# Patient Record
Sex: Female | Born: 1945 | Race: White | Hispanic: No | Smoking: Never smoker
Health system: Southern US, Community
[De-identification: ages and names within clinical notes are randomized; demographics above are authoritative.]

## PROBLEM LIST (undated history)

## (undated) DIAGNOSIS — H269 Unspecified cataract: Secondary | ICD-10-CM

## (undated) DIAGNOSIS — M1990 Unspecified osteoarthritis, unspecified site: Secondary | ICD-10-CM

## (undated) DIAGNOSIS — F32A Depression, unspecified: Secondary | ICD-10-CM

## (undated) DIAGNOSIS — E785 Hyperlipidemia, unspecified: Secondary | ICD-10-CM

## (undated) DIAGNOSIS — F419 Anxiety disorder, unspecified: Secondary | ICD-10-CM

## (undated) DIAGNOSIS — T7840XA Allergy, unspecified, initial encounter: Secondary | ICD-10-CM

## (undated) DIAGNOSIS — R32 Unspecified urinary incontinence: Secondary | ICD-10-CM

## (undated) DIAGNOSIS — I10 Essential (primary) hypertension: Secondary | ICD-10-CM

## (undated) HISTORY — DX: Unspecified cataract: H26.9

## (undated) HISTORY — DX: Anxiety disorder, unspecified: F41.9

## (undated) HISTORY — DX: Depression, unspecified: F32.A

## (undated) HISTORY — PX: BREAST SURGERY: SHX581

## (undated) HISTORY — DX: Unspecified osteoarthritis, unspecified site: M19.90

## (undated) HISTORY — DX: Essential (primary) hypertension: I10

## (undated) HISTORY — DX: Allergy, unspecified, initial encounter: T78.40XA

## (undated) HISTORY — DX: Unspecified urinary incontinence: R32

## (undated) HISTORY — DX: Hyperlipidemia, unspecified: E78.5

---

## 1976-05-07 HISTORY — PX: OTHER SURGICAL HISTORY: SHX169

## 2005-10-23 ENCOUNTER — Encounter: Admission: RE | Admit: 2005-10-23 | Discharge: 2005-10-23 | Payer: Self-pay | Admitting: Gynecology

## 2006-04-08 ENCOUNTER — Other Ambulatory Visit: Admission: RE | Admit: 2006-04-08 | Discharge: 2006-04-08 | Payer: Self-pay | Admitting: Gynecology

## 2006-11-28 ENCOUNTER — Encounter: Admission: RE | Admit: 2006-11-28 | Discharge: 2006-11-28 | Payer: Self-pay | Admitting: Gynecology

## 2007-12-18 ENCOUNTER — Encounter: Admission: RE | Admit: 2007-12-18 | Discharge: 2007-12-18 | Payer: Self-pay | Admitting: Gynecology

## 2008-12-23 ENCOUNTER — Encounter: Admission: RE | Admit: 2008-12-23 | Discharge: 2008-12-23 | Payer: Self-pay | Admitting: Gynecology

## 2009-12-29 ENCOUNTER — Encounter: Admission: RE | Admit: 2009-12-29 | Discharge: 2009-12-29 | Payer: Self-pay | Admitting: Gynecology

## 2010-12-29 ENCOUNTER — Other Ambulatory Visit: Payer: Self-pay | Admitting: Gynecology

## 2010-12-29 DIAGNOSIS — Z1231 Encounter for screening mammogram for malignant neoplasm of breast: Secondary | ICD-10-CM

## 2011-01-06 LAB — HM MAMMOGRAPHY

## 2011-01-31 ENCOUNTER — Ambulatory Visit
Admission: RE | Admit: 2011-01-31 | Discharge: 2011-01-31 | Disposition: A | Discharge status: Home / Self Care | Payer: Medicare Other | Source: Ambulatory Visit | Attending: Gynecology | Admitting: Gynecology

## 2011-01-31 DIAGNOSIS — Z1231 Encounter for screening mammogram for malignant neoplasm of breast: Secondary | ICD-10-CM

## 2011-03-01 ENCOUNTER — Encounter: Payer: Self-pay | Admitting: Family Medicine

## 2011-03-01 ENCOUNTER — Ambulatory Visit (INDEPENDENT_AMBULATORY_CARE_PROVIDER_SITE_OTHER): Payer: Medicare Other | Admitting: Family Medicine

## 2011-03-01 VITALS — BP 132/76 | HR 66 | Temp 98.0°F | Ht 62.5 in | Wt 139.0 lb

## 2011-03-01 DIAGNOSIS — M949 Disorder of cartilage, unspecified: Secondary | ICD-10-CM

## 2011-03-01 DIAGNOSIS — I10 Essential (primary) hypertension: Secondary | ICD-10-CM

## 2011-03-01 DIAGNOSIS — Z1211 Encounter for screening for malignant neoplasm of colon: Secondary | ICD-10-CM

## 2011-03-01 DIAGNOSIS — M8580 Other specified disorders of bone density and structure, unspecified site: Secondary | ICD-10-CM

## 2011-03-01 DIAGNOSIS — F329 Major depressive disorder, single episode, unspecified: Secondary | ICD-10-CM

## 2011-03-01 DIAGNOSIS — Z23 Encounter for immunization: Secondary | ICD-10-CM

## 2011-03-01 NOTE — Patient Instructions (Signed)
I would think about completing the stool cards.   You can go by the [place] [facility] lab (at the [facility]) to get your fasting labs done.  You shouldn't need an appointment.  You can get your results through our phone system.  Follow the instructions on the blue card. Take care. Don't change your meds.  Glad to see you.

## 2011-03-01 NOTE — Progress Notes (Signed)
Mood:  Mother is in nursing home in [place].  Brother recently got married and this has been a stressful situation.  Pt is planning on getting married soon.  More anxiety related to family now than depressive sx.  No SI/HI.  Sleeping okay.  She has supportive relationship and has things to look forward to.  She thinks the situation is tolerable.  She had tried counseling prev, short duration w/o much effect.  She would like not to change her meds at this point.   Hypertension:    Using medication without problems or lightheadedness: yes Chest pain with exertion:no Edema:no Short of breath:no Average home BPs: ~100-110/60s Due for labs.   Osteopenia.  DXA 12/2009.  Due for vit D recheck.  Nonsmoker.  Does weight bearing exercise.    Colon cancer screening, prev with IFOB offered by Dr. Nicholas Lose. Pt had prev declined colonoscopy.   Meds, vitals, and allergies reviewed.   PMH and SH reviewed  ROS: See HPI.  Otherwise negative.    GEN: nad, alert and oriented HEENT: mucous membranes moist NECK: supple w/o LA CV: rrr. PULM: ctab, no inc wob ABD: soft, +bs EXT: no edema SKIN: no acute rash

## 2011-03-02 ENCOUNTER — Encounter: Payer: Self-pay | Admitting: Family Medicine

## 2011-03-02 DIAGNOSIS — Z1211 Encounter for screening for malignant neoplasm of colon: Secondary | ICD-10-CM | POA: Insufficient documentation

## 2011-03-02 DIAGNOSIS — I10 Essential (primary) hypertension: Secondary | ICD-10-CM | POA: Insufficient documentation

## 2011-03-02 DIAGNOSIS — F329 Major depressive disorder, single episode, unspecified: Secondary | ICD-10-CM | POA: Insufficient documentation

## 2011-03-02 DIAGNOSIS — M8580 Other specified disorders of bone density and structure, unspecified site: Secondary | ICD-10-CM | POA: Insufficient documentation

## 2011-03-02 NOTE — Assessment & Plan Note (Signed)
No change in meds.  Continue diet.  Labs pending.

## 2011-03-02 NOTE — Assessment & Plan Note (Signed)
No change in meds.  Continue as is.  She'll notify me if sx increase.  She declined referral for counseling.

## 2011-03-02 NOTE — Assessment & Plan Note (Signed)
D/w pt about IFOB and she'll consider.  She is aware of potential morbidity/mortality benefit from screening.

## 2011-03-02 NOTE — Assessment & Plan Note (Signed)
DXA per gyn, check vit D.  Continue exercise .

## 2011-03-28 ENCOUNTER — Other Ambulatory Visit (INDEPENDENT_AMBULATORY_CARE_PROVIDER_SITE_OTHER): Payer: Medicare Other

## 2011-03-28 ENCOUNTER — Other Ambulatory Visit: Payer: Self-pay | Admitting: Family Medicine

## 2011-03-28 DIAGNOSIS — M8580 Other specified disorders of bone density and structure, unspecified site: Secondary | ICD-10-CM

## 2011-03-28 DIAGNOSIS — I10 Essential (primary) hypertension: Secondary | ICD-10-CM

## 2011-03-28 LAB — COMPREHENSIVE METABOLIC PANEL
AST: 20 U/L (ref 0–37)
Alkaline Phosphatase: 105 U/L (ref 39–117)
BUN: 15 mg/dL (ref 6–23)
CO2: 28 mEq/L (ref 19–32)
Creatinine, Ser: 0.8 mg/dL (ref 0.4–1.2)
Total Bilirubin: 0.8 mg/dL (ref 0.3–1.2)
Total Protein: 7.4 g/dL (ref 6.0–8.3)

## 2011-03-28 LAB — LIPID PANEL: VLDL: 33.6 mg/dL (ref 0.0–40.0)

## 2011-03-28 LAB — LDL CHOLESTEROL, DIRECT: Direct LDL: 183.8 mg/dL

## 2011-03-30 ENCOUNTER — Other Ambulatory Visit: Payer: Self-pay | Admitting: Family Medicine

## 2011-03-30 DIAGNOSIS — E7800 Pure hypercholesterolemia, unspecified: Secondary | ICD-10-CM | POA: Insufficient documentation

## 2011-06-25 ENCOUNTER — Telehealth: Payer: Self-pay

## 2011-06-25 DIAGNOSIS — E7800 Pure hypercholesterolemia, unspecified: Secondary | ICD-10-CM

## 2011-06-25 NOTE — Telephone Encounter (Signed)
Patient called requesting a lab order for bloodwork.  She has an appointment to see you next week and needs to have her blood drawn before then.  She wants to go to another [place] office that opens at 7:30a.

## 2011-06-26 NOTE — Telephone Encounter (Signed)
Ordered, all she needs is a lipid panel. Thanks.

## 2011-06-26 NOTE — Telephone Encounter (Signed)
Patient advised.

## 2011-06-27 ENCOUNTER — Other Ambulatory Visit (INDEPENDENT_AMBULATORY_CARE_PROVIDER_SITE_OTHER): Payer: Medicare Other

## 2011-06-27 DIAGNOSIS — E7800 Pure hypercholesterolemia, unspecified: Secondary | ICD-10-CM

## 2011-06-27 LAB — LIPID PANEL
HDL: 46.1 mg/dL (ref >39.00)
Total CHOL/HDL Ratio: 5

## 2011-06-27 LAB — LDL CHOLESTEROL, DIRECT: Direct LDL: 172.9 mg/dL

## 2011-07-03 ENCOUNTER — Encounter: Payer: Self-pay | Admitting: Family Medicine

## 2011-07-03 ENCOUNTER — Ambulatory Visit (INDEPENDENT_AMBULATORY_CARE_PROVIDER_SITE_OTHER): Payer: Medicare Other | Admitting: Family Medicine

## 2011-07-03 DIAGNOSIS — I10 Essential (primary) hypertension: Secondary | ICD-10-CM

## 2011-07-03 DIAGNOSIS — E7800 Pure hypercholesterolemia, unspecified: Secondary | ICD-10-CM

## 2011-07-03 DIAGNOSIS — L679 Hair color and hair shaft abnormality, unspecified: Secondary | ICD-10-CM

## 2011-07-03 DIAGNOSIS — L659 Nonscarring hair loss, unspecified: Secondary | ICD-10-CM

## 2011-07-03 DIAGNOSIS — L739 Follicular disorder, unspecified: Secondary | ICD-10-CM

## 2011-07-03 MED ORDER — PRAVASTATIN SODIUM 40 MG PO TABS: 40.0000 mg | ORAL_TABLET | Freq: Every day | ORAL | Status: DC

## 2011-07-03 NOTE — Progress Notes (Signed)
Mother is on hospice in [place].  This has been difficult for her.  She's planning on going down in about 2 weeks.  She last saw her at Christmas.    Hypertension:   Using medication without problems or lightheadedness: yes Chest pain with exertion:no Edema:no Short of breath:no BP controlled at home, lower than today's check.   Noted thinning hair and difficulty losing weight, noted over last few months.  TSH pending.    Elevated Cholesterol: no meds.  She's been working on diet.  Lipids are still up.  We talked about options. Never on meds.   PMH and SH reviewed  Meds, vitals, and allergies reviewed.   ROS: See HPI.  Otherwise negative.    GEN: nad, alert and oriented HEENT: mucous membranes moist NECK: supple w/o LA CV: rrr. PULM: ctab, no inc wob ABD: soft, +bs EXT: no edema SKIN: no acute rash but patchy thinning hair.

## 2011-07-03 NOTE — Patient Instructions (Signed)
Recheck labs in 2 months.   Check thyroid today.  You can get your results through our phone system.  Follow the instructions on the blue card. I would start the pravastatin.  Let me know if you have aches (and stop the medicine).

## 2011-07-05 DIAGNOSIS — L659 Nonscarring hair loss, unspecified: Secondary | ICD-10-CM | POA: Insufficient documentation

## 2011-07-05 NOTE — Assessment & Plan Note (Signed)
Continue current meds. Labs d/w pt.  

## 2011-07-05 NOTE — Assessment & Plan Note (Signed)
Start prava, return for labs.  Routine counseling JW:[id number] and ADE given. Understood.

## 2011-07-05 NOTE — Assessment & Plan Note (Signed)
TSH wnl, will follow.  

## 2011-10-11 ENCOUNTER — Other Ambulatory Visit: Payer: Medicare Other

## 2011-10-11 ENCOUNTER — Other Ambulatory Visit (INDEPENDENT_AMBULATORY_CARE_PROVIDER_SITE_OTHER): Payer: Medicare Other

## 2011-10-11 DIAGNOSIS — E7800 Pure hypercholesterolemia, unspecified: Secondary | ICD-10-CM

## 2011-10-11 LAB — LIPID PANEL
LDL Cholesterol: 79 mg/dL (ref 0–99)
Triglycerides: 127 mg/dL (ref 0.0–149.0)
VLDL: 25.4 mg/dL (ref 0.0–40.0)

## 2011-10-11 LAB — HEPATIC FUNCTION PANEL
ALT: 25 U/L (ref 0–35)
AST: 21 U/L (ref 0–37)
Alkaline Phosphatase: 86 U/L (ref 39–117)
Total Protein: 7.2 g/dL (ref 6.0–8.3)

## 2011-10-15 ENCOUNTER — Encounter: Payer: Self-pay | Admitting: *Deleted

## 2011-10-15 ENCOUNTER — Ambulatory Visit: Payer: Medicare Other | Admitting: Family Medicine

## 2011-10-19 ENCOUNTER — Encounter: Payer: Self-pay | Admitting: Family Medicine

## 2011-10-19 ENCOUNTER — Ambulatory Visit (INDEPENDENT_AMBULATORY_CARE_PROVIDER_SITE_OTHER): Payer: Medicare Other | Admitting: Family Medicine

## 2011-10-19 VITALS — BP 112/64 | HR 73 | Temp 98.1°F | Wt 149.0 lb

## 2011-10-19 DIAGNOSIS — F329 Major depressive disorder, single episode, unspecified: Secondary | ICD-10-CM

## 2011-10-19 DIAGNOSIS — I998 Other disorder of circulatory system: Secondary | ICD-10-CM

## 2011-10-19 DIAGNOSIS — E7800 Pure hypercholesterolemia, unspecified: Secondary | ICD-10-CM

## 2011-10-19 DIAGNOSIS — T753XXA Motion sickness, initial encounter: Secondary | ICD-10-CM

## 2011-10-19 DIAGNOSIS — I10 Essential (primary) hypertension: Secondary | ICD-10-CM

## 2011-10-19 DIAGNOSIS — R6889 Other general symptoms and signs: Secondary | ICD-10-CM

## 2011-10-19 MED ORDER — SCOPOLAMINE 1 MG/3DAYS TD PT72: 1.0000 | MEDICATED_PATCH | TRANSDERMAL | Status: DC

## 2011-10-19 NOTE — Progress Notes (Signed)
Mood is good in spice of the fact that her mother passed away this spring; she is working through this.  She's going to [place] to see her brother in the near future.    Hair loss has stabilized.  No recent changes.  No med changes to explain the loss.  Her mother had similar.    Elevated Cholesterol: Using medications without problems:yes Muscle aches: no Diet compliance:yes Exercise: yes Lipids are much improved.    She's going on a long cruise (several weeks) and would potentially need a scopolamine patch.    L arm had a higher BP today and this has happened in the past per patient report.  No CP, SOB, BLE edema.  No hand pain.  On  BP meds as per routine.    PMH and SH reviewed  ROS: See HPI, otherwise noncontributory.  Meds, vitals, and allergies reviewed.   nad ncat Mmm rrr ctab  abd soft, not ttp Ext w/o edema Persisent 20+ difference in SBP, L >R arm.  Symmetric pulses radially.

## 2011-10-19 NOTE — Patient Instructions (Addendum)
Let me talk to radiology and we'll notify you about options.   Don't change your meds. Enjoy the cruise.  Take care.

## 2011-10-22 ENCOUNTER — Other Ambulatory Visit (INDEPENDENT_AMBULATORY_CARE_PROVIDER_SITE_OTHER): Payer: Medicare Other

## 2011-10-22 ENCOUNTER — Other Ambulatory Visit: Payer: Self-pay | Admitting: Family Medicine

## 2011-10-22 DIAGNOSIS — T753XXA Motion sickness, initial encounter: Secondary | ICD-10-CM | POA: Insufficient documentation

## 2011-10-22 DIAGNOSIS — I10 Essential (primary) hypertension: Secondary | ICD-10-CM

## 2011-10-22 NOTE — Assessment & Plan Note (Signed)
Mood is stable, affect wnl. Continue current meds.

## 2011-10-22 NOTE — Assessment & Plan Note (Signed)
Routine instructions given re: scopolamine patch.

## 2011-10-22 NOTE — Assessment & Plan Note (Signed)
Improved, continue current meds.  Labs d/w pt.

## 2011-10-22 NOTE — Assessment & Plan Note (Signed)
With altered BP in arms.  Will refer for CT angio chest, attention thoracic aorta.  Continue current meds in meantime.  She agrees.

## 2011-10-29 ENCOUNTER — Other Ambulatory Visit: Payer: Self-pay | Admitting: Family Medicine

## 2011-10-29 DIAGNOSIS — I10 Essential (primary) hypertension: Secondary | ICD-10-CM

## 2011-11-01 ENCOUNTER — Other Ambulatory Visit (INDEPENDENT_AMBULATORY_CARE_PROVIDER_SITE_OTHER): Payer: Medicare Other

## 2011-11-01 DIAGNOSIS — I10 Essential (primary) hypertension: Secondary | ICD-10-CM

## 2011-11-01 LAB — BASIC METABOLIC PANEL
BUN: 16 mg/dL (ref 6–23)
Chloride: 106 mEq/L (ref 96–112)
GFR: 89.85 mL/min (ref >60.00)
Glucose, Bld: 129 mg/dL — ABNORMAL HIGH (ref 70–99)
Sodium: 143 mEq/L (ref 135–145)

## 2011-11-02 ENCOUNTER — Ambulatory Visit (INDEPENDENT_AMBULATORY_CARE_PROVIDER_SITE_OTHER)
Admission: RE | Admit: 2011-11-02 | Discharge: 2011-11-02 | Disposition: A | Discharge status: Home / Self Care | Payer: Medicare Other | Source: Ambulatory Visit | Attending: Family Medicine | Admitting: Family Medicine

## 2011-11-02 DIAGNOSIS — R6889 Other general symptoms and signs: Secondary | ICD-10-CM

## 2011-11-02 DIAGNOSIS — I998 Other disorder of circulatory system: Secondary | ICD-10-CM

## 2011-11-02 MED ORDER — IOHEXOL 300 MG/ML  SOLN
100.0000 mL | Freq: Once | INTRAMUSCULAR | Status: AC | PRN
  Administered 2011-11-02: 100 mL via INTRAVENOUS

## 2011-11-05 ENCOUNTER — Telehealth: Payer: Self-pay | Admitting: Family Medicine

## 2011-11-05 NOTE — Telephone Encounter (Signed)
Please talk to me about this tomorrow so we can get the order sets ready.  Thanks.  Okay to give both to her.

## 2011-11-05 NOTE — Telephone Encounter (Signed)
Caller: Kambryn/Patient; PCP: Crawford Givens Clelia Croft); CB#: [phone]; ; ; Call regarding Needs Hep Shot and Shingles Shot;  Pt calling regarding she and spouse traveling to [place] in 3 weeks and need Hep A and Shingles vaccines. Spouse is Liara Holm 10/03/43 also Dr. Para March pt.

## 2011-11-06 NOTE — Telephone Encounter (Signed)
pts left v/m requesting call back for scheduling of hep injection. [phone].

## 2011-11-06 NOTE — Telephone Encounter (Signed)
Appt made on RN schedule.  Order set done and in [facility].

## 2011-11-09 ENCOUNTER — Ambulatory Visit (INDEPENDENT_AMBULATORY_CARE_PROVIDER_SITE_OTHER): Payer: Medicare Other

## 2011-11-09 DIAGNOSIS — Z2911 Encounter for prophylactic immunotherapy for respiratory syncytial virus (RSV): Secondary | ICD-10-CM

## 2011-11-09 DIAGNOSIS — Z23 Encounter for immunization: Secondary | ICD-10-CM

## 2012-03-03 ENCOUNTER — Other Ambulatory Visit: Payer: Self-pay | Admitting: Gynecology

## 2012-03-03 DIAGNOSIS — Z1231 Encounter for screening mammogram for malignant neoplasm of breast: Secondary | ICD-10-CM

## 2012-03-17 ENCOUNTER — Ambulatory Visit
Admission: RE | Admit: 2012-03-17 | Discharge: 2012-03-17 | Disposition: A | Discharge status: Home / Self Care | Payer: Medicare Other | Source: Ambulatory Visit | Attending: Gynecology | Admitting: Gynecology

## 2012-03-17 DIAGNOSIS — Z1231 Encounter for screening mammogram for malignant neoplasm of breast: Secondary | ICD-10-CM

## 2012-03-21 ENCOUNTER — Other Ambulatory Visit: Payer: Self-pay | Admitting: Gynecology

## 2012-03-21 DIAGNOSIS — R928 Other abnormal and inconclusive findings on diagnostic imaging of breast: Secondary | ICD-10-CM

## 2012-03-24 ENCOUNTER — Ambulatory Visit
Admission: RE | Admit: 2012-03-24 | Discharge: 2012-03-24 | Disposition: A | Discharge status: Home / Self Care | Payer: Medicare Other | Source: Ambulatory Visit | Attending: Gynecology | Admitting: Gynecology

## 2012-03-24 DIAGNOSIS — R928 Other abnormal and inconclusive findings on diagnostic imaging of breast: Secondary | ICD-10-CM

## 2012-06-16 ENCOUNTER — Other Ambulatory Visit: Payer: Self-pay | Admitting: Family Medicine

## 2012-08-04 ENCOUNTER — Other Ambulatory Visit: Payer: Self-pay

## 2012-08-04 ENCOUNTER — Telehealth: Payer: Self-pay | Admitting: Family Medicine

## 2012-08-04 MED ORDER — PRAVASTATIN SODIUM 40 MG PO TABS: ORAL_TABLET | ORAL | Status: DC

## 2012-08-04 NOTE — Telephone Encounter (Signed)
Patient is coming in for a cpx on 10/14/12.  Patient would like to have her lab work done at the [facility].  Please put orders in computer for lab work.

## 2012-08-04 NOTE — Telephone Encounter (Signed)
Pt request refill pravastatin 40 mg # 90 x 0 to [facility]. Pt notified done and pt will call back to schedule appt. Refills remaining at [facility] [address] were d/c.

## 2012-08-05 ENCOUNTER — Other Ambulatory Visit: Payer: Self-pay | Admitting: Family Medicine

## 2012-08-05 DIAGNOSIS — M8580 Other specified disorders of bone density and structure, unspecified site: Secondary | ICD-10-CM

## 2012-08-05 DIAGNOSIS — I10 Essential (primary) hypertension: Secondary | ICD-10-CM

## 2012-08-05 NOTE — Telephone Encounter (Signed)
Orders are in

## 2012-08-15 ENCOUNTER — Other Ambulatory Visit: Payer: Self-pay | Admitting: Family Medicine

## 2012-10-03 ENCOUNTER — Telehealth: Payer: Self-pay

## 2012-10-03 NOTE — Telephone Encounter (Signed)
Pt scheduled CPX on 10/14/12 with Dr Para March and pt request lab orders for CPE placed in Epic; pt wants to go to [facility] lab (more convenient for pt) first of next week to have CPE labs done. Pt request call back when orders are in chart.

## 2012-10-05 NOTE — Telephone Encounter (Signed)
Orders are already in.  She should be able to go to [facility] as is.

## 2012-10-06 NOTE — Telephone Encounter (Signed)
Patient advised.

## 2012-10-08 ENCOUNTER — Other Ambulatory Visit (INDEPENDENT_AMBULATORY_CARE_PROVIDER_SITE_OTHER): Payer: Medicare Other

## 2012-10-08 DIAGNOSIS — M8580 Other specified disorders of bone density and structure, unspecified site: Secondary | ICD-10-CM

## 2012-10-08 DIAGNOSIS — I10 Essential (primary) hypertension: Secondary | ICD-10-CM

## 2012-10-08 LAB — COMPREHENSIVE METABOLIC PANEL
ALT: 22 U/L (ref 0–35)
AST: 20 U/L (ref 0–37)
Albumin: 3.9 g/dL (ref 3.5–5.2)
Alkaline Phosphatase: 81 U/L (ref 39–117)
Potassium: 4 mEq/L (ref 3.5–5.1)
Sodium: 138 mEq/L (ref 135–145)
Total Bilirubin: 0.7 mg/dL (ref 0.3–1.2)
Total Protein: 7.3 g/dL (ref 6.0–8.3)

## 2012-10-08 LAB — LIPID PANEL
HDL: 38.5 mg/dL — ABNORMAL LOW (ref >39.00)
Total CHOL/HDL Ratio: 4
VLDL: 22 mg/dL (ref 0.0–40.0)

## 2012-10-12 ENCOUNTER — Other Ambulatory Visit: Payer: Self-pay | Admitting: Family Medicine

## 2012-10-14 ENCOUNTER — Ambulatory Visit (INDEPENDENT_AMBULATORY_CARE_PROVIDER_SITE_OTHER): Payer: Medicare Other | Admitting: Family Medicine

## 2012-10-14 ENCOUNTER — Encounter: Payer: Self-pay | Admitting: Family Medicine

## 2012-10-14 VITALS — BP 148/80 | HR 74 | Temp 97.9°F | Ht 62.0 in | Wt 147.8 lb

## 2012-10-14 DIAGNOSIS — Z23 Encounter for immunization: Secondary | ICD-10-CM

## 2012-10-14 DIAGNOSIS — I10 Essential (primary) hypertension: Secondary | ICD-10-CM

## 2012-10-14 DIAGNOSIS — Z0000 Encounter for general adult medical examination without abnormal findings: Secondary | ICD-10-CM

## 2012-10-14 DIAGNOSIS — F411 Generalized anxiety disorder: Secondary | ICD-10-CM

## 2012-10-14 DIAGNOSIS — E7800 Pure hypercholesterolemia, unspecified: Secondary | ICD-10-CM

## 2012-10-14 DIAGNOSIS — Z1211 Encounter for screening for malignant neoplasm of colon: Secondary | ICD-10-CM

## 2012-10-14 MED ORDER — CITALOPRAM HYDROBROMIDE 20 MG PO TABS: 20.0000 mg | ORAL_TABLET | Freq: Every day | ORAL | Status: DC

## 2012-10-14 MED ORDER — PRAVASTATIN SODIUM 40 MG PO TABS: ORAL_TABLET | ORAL | Status: DC

## 2012-10-14 MED ORDER — OLMESARTAN MEDOXOMIL-HCTZ 20-12.5 MG PO TABS: 0.5000 | ORAL_TABLET | Freq: Every day | ORAL | Status: DC

## 2012-10-14 NOTE — Assessment & Plan Note (Addendum)
See scanned forms.  Routine anticipatory guidance given to patient.  See health maintenance. Flu yearly Shingles done PNA done  Tetanus 2010 D/w patient [id number] for colon cancer screening, including IFOB vs. colonoscopy.  Risks and benefits of both were discussed and patient voiced understanding.  Pt elects WUJ:WJXB.  Breast cancer screening and DXA per gyn Advance directive- would have her husband Onalee Hua designated if incapacitated.  2nd HAV done today.  Cognitive function addressed- see scanned forms- and if abnormal then additional documentation follows.

## 2012-10-14 NOTE — Progress Notes (Signed)
I have personally reviewed the Medicare Annual Wellness questionnaire and have noted 1. The patient's medical and social history 2. Their use of alcohol, tobacco or illicit drugs 3. Their current medications and supplements 4. The patient's functional ability including ADL's, fall risks, home safety risks and hearing or visual             impairment. 5. Diet and physical activities 6. Evidence for depression or mood disorders  The patients weight, height, BMI have been recorded in the chart and visual acuity is per eye clinic.  I have made referrals, counseling and provided education to the patient based review of the above and I have provided the pt with a written personalized care plan for preventive services.  See scanned forms.  Routine anticipatory guidance given to patient.  See health maintenance. Flu yearly Shingles done PNA done  Tetanus 2010 D/w patient [id number] for colon cancer screening, including IFOB vs. colonoscopy.  Risks and benefits of both were discussed and patient voiced understanding.  Pt elects WUJ:WJXB.  Breast cancer screening and DXA per gyn Advance directive- would have her husband Onalee Hua designated if incapacitated.  Cognitive function addressed- see scanned forms- and if abnormal then additional documentation follows.   Hypertension:    Using medication without problems- no, she was noted to be fatigued and her BP was low on home checks (likely lower BP after the stress of her mother's illness resolved) Chest pain with exertion:yes Edema:no Short of breath:no  She has had some pain at the L 4th PIP.  The joint occ puffs up and is painful.  No trauma.   Elevated Cholesterol: Using medications without problems:yes Muscle aches: no Diet compliance: yes Exercise:yes  Anxiety.  Doing well.  D/w pt about SSRI.  She wants to continue.  No ADE.   Her prev hair loss has stabilized.   PMH and SH reviewed  Meds, vitals, and allergies reviewed.   ROS: See  HPI.  Otherwise negative.    GEN: nad, alert and oriented HEENT: mucous membranes moist NECK: supple w/o LA CV: rrr. PULM: ctab, no inc wob ABD: soft, +bs EXT: no edema SKIN: no acute rash

## 2012-10-14 NOTE — Assessment & Plan Note (Signed)
Would dec the dose given the lower BPs.  Continue as is with diet and exercise.  Labs okay, d/w pt.  She agrees.

## 2012-10-14 NOTE — Patient Instructions (Addendum)
Try cutting back on the BP medicine.  Go back to a whole pill if needed.  Note dose change.  Keep working on [employer].  Enjoy the trips coming up.  I would get a flu shot each fall.

## 2012-10-14 NOTE — Assessment & Plan Note (Signed)
Controlled, continue current SSRI, doing well.

## 2012-10-14 NOTE — Assessment & Plan Note (Signed)
Would continue statin.  Continue as is with diet and exercise.  Labs okay, d/w pt.  She agrees.

## 2013-03-12 ENCOUNTER — Other Ambulatory Visit: Payer: Self-pay

## 2013-05-18 ENCOUNTER — Encounter: Payer: Self-pay | Admitting: Gynecology

## 2013-05-18 ENCOUNTER — Ambulatory Visit (INDEPENDENT_AMBULATORY_CARE_PROVIDER_SITE_OTHER): Payer: Medicare Other | Admitting: Gynecology

## 2013-05-18 VITALS — BP 120/76 | Ht 62.5 in | Wt 154.0 lb

## 2013-05-18 DIAGNOSIS — N952 Postmenopausal atrophic vaginitis: Secondary | ICD-10-CM

## 2013-05-18 DIAGNOSIS — M8580 Other specified disorders of bone density and structure, unspecified site: Secondary | ICD-10-CM

## 2013-05-18 DIAGNOSIS — M949 Disorder of cartilage, unspecified: Secondary | ICD-10-CM

## 2013-05-18 DIAGNOSIS — R32 Unspecified urinary incontinence: Secondary | ICD-10-CM

## 2013-05-18 DIAGNOSIS — M899 Disorder of bone, unspecified: Secondary | ICD-10-CM

## 2013-05-18 MED ORDER — FLUCONAZOLE 150 MG PO TABS: 150.0000 mg | ORAL_TABLET | Freq: Once | ORAL | Status: DC

## 2013-05-18 MED ORDER — METRONIDAZOLE 500 MG PO TABS: 500.0000 mg | ORAL_TABLET | Freq: Two times a day (BID) | ORAL | Status: DC

## 2013-05-18 NOTE — Patient Instructions (Signed)
Schedule colonoscopy with [facility] at [phone] or [facility] at [phone] Followup in one year for annual exam, sooner as needed

## 2013-05-18 NOTE — Progress Notes (Signed)
Sheila Nguyen [birth year] [id number]        67 y.o.  G1P1 for followup exam.  Former patient of Dr. Ubaldo Glassing. Several issues noted below.  Past medical history,surgical history, problem list, medications, allergies, family history and social history were all reviewed and documented in the EPIC chart.  ROS:  Performed and pertinent positives and negatives are included in the history, assessment and plan .  Exam: Kim assistant Filed Vitals:   05/18/13 0903  BP: 120/76  Height: 5' 2.5" (1.588 m)  Weight: 154 lb (69.854 kg)   General appearance  Normal Skin grossly normal Head/Neck normal with no cervical or supraclavicular adenopathy thyroid normal Lungs  clear Cardiac RR, without RMG Abdominal  soft, nontender, without masses, organomegaly or hernia Breasts  examined lying and sitting without masses, retractions, discharge or axillary adenopathy. Pelvic  Ext/BUS/vagina  with generalized atrophic changes.  Cervix  Normal with atrophic changes  Uterus  anteverted, normal size, shape and contour, midline and mobile nontender   Adnexa  Without masses or tenderness    Anus and perineum  Normal   Rectovaginal  Normal sphincter tone without palpated masses or tenderness.    Assessment/Plan:  68 y.o. G1P1 female for annual exam.   1. Postmenopausal/atrophic genital changes. Patient without significant symptoms of hot flushes, night sweats, vaginal dryness. Is not sexually active. Had been on vaginal estrogen cream per Dr. Ubaldo Glassing for bladder support as noted in #2. Did not use it consistently and ultimately decided against using it. No history of vaginal bleeding. Will continue to monitor. Call if any vaginal bleeding. 2. Urinary incontinence. Consistent with SUI. No urgency symptoms. Seems to occur intermittently but not consistently. Reviewed options to include observation, behavior modification to include avoiding carbonated beverages, caffeine and frequent voiding to keep bladder on the  empty side. Surgery to include sling reviewed with referral to urology. The issues as to whether vaginal estrogen helps with bladder control reviewed. The issues with vaginal estrogen discussed to include potential for absorption and systemic issues such as increased risk of thrombosis such as stroke heart attack DVT, endometrial stimulation and breast cancer issues all discussed. Patient never really used it consistently and does not want to use it. She will try some of the behavior modification techniques. Followup if patient wants to pursue more involved evaluation and treatment. Check urinalysis today. 3. Osteopenia historically per Dr. Damita Dunnings entry. Do not have copies of her DEXA which is done through his office. Continue followup with him in reference to bone health and recommendation. Increase calcium vitamin D reviewed. 4. Pap smear reported 2013. No Pap smear done today. No history of abnormal Pap smears historically. Reviewed current screening guidelines and options to stop screening altogether as she is over 65 or less frequent screening intervals reviewed. Will readdress on an annual basis. 5. Mammography 03/2013 at [facility] historically. Continue with annual mammography. SBE monthly reviewed. 6. Colonoscopy never. Patient states she will never have one. I reviewed the benefits of early detection and precancerous polyp removal and it can be a life saving procedure. Patient clearly understands the issues and I strongly recommended her to schedule a screening colonoscopy and she will decide. 7. Health maintenance. No lab work done and she reports this is all done through Dr. Josefine Class office. Followup one year, sooner as needed.  Note: This document was prepared with digital dictation and possible smart phrase technology. Any transcriptional errors that result from this process are unintentional.   Anastasio Auerbach MD, 9:51 AM  05/18/2013      

## 2013-05-19 LAB — URINALYSIS W MICROSCOPIC + REFLEX CULTURE
Bacteria, UA: NONE SEEN
Bilirubin Urine: NEGATIVE
Casts: NONE SEEN
Crystals: NONE SEEN
Glucose, UA: NEGATIVE mg/dL
HGB URINE DIPSTICK: NEGATIVE
Ketones, ur: NEGATIVE mg/dL
Nitrite: NEGATIVE
PROTEIN: NEGATIVE mg/dL
Specific Gravity, Urine: 1.006 (ref 1.005–1.030)
UROBILINOGEN UA: 0.2 mg/dL (ref 0.0–1.0)
pH: 7 (ref 5.0–8.0)

## 2013-05-21 ENCOUNTER — Other Ambulatory Visit: Payer: Self-pay | Admitting: Gynecology

## 2013-05-21 ENCOUNTER — Telehealth: Payer: Self-pay

## 2013-05-21 LAB — URINE CULTURE

## 2013-05-21 MED ORDER — SULFAMETHOXAZOLE-TMP DS 800-160 MG PO TABS: 1.0000 | ORAL_TABLET | Freq: Two times a day (BID) | ORAL | Status: DC

## 2013-05-21 NOTE — Telephone Encounter (Signed)
Patient called very upset. [facility] contacted her and told her she had two Rx's to pick up.  I looked at her visit and there were none prescribed.  However, patient said [facility] told her Metronidazole and Diflucan called in.  I do see these in her medication list for 05/18/13.  Patient upset that "no one bothered to tell her about this except [facility]".   I asked her to allow me to research this and I will call her back. She was actually in her car on her way to the office. Very upset.

## 2013-05-21 NOTE — Telephone Encounter (Signed)
That was an error entry that was meant for another patient.  It was recognized immediately after entered and the medication orders were discontinued which the pharmacy should have recognized and not filled the prescription. I am sorry if this caused the patient concern or anxiety but the actual air was from the pharmacy side where they did not pick up on the discontinue order was filed the same day. The patient does not need any antibiotics. You can tear 5 despite looking in the prescription history and see that it was labeled error entry on 05/18/2013.

## 2013-05-21 NOTE — Telephone Encounter (Signed)
I called patient back and explained below.  Patient was obviously much calmer this time we talked and she was understanding as to what had happened and accepted my apology for the entry error.  I called [facility] and let the pharmacist know to cancel those Rx's.

## 2013-05-22 ENCOUNTER — Encounter: Payer: Self-pay | Admitting: Family Medicine

## 2013-09-07 ENCOUNTER — Telehealth: Payer: Self-pay | Admitting: Family Medicine

## 2013-09-07 DIAGNOSIS — I10 Essential (primary) hypertension: Secondary | ICD-10-CM

## 2013-09-07 DIAGNOSIS — M8580 Other specified disorders of bone density and structure, unspecified site: Secondary | ICD-10-CM

## 2013-09-07 NOTE — Telephone Encounter (Signed)
Orders are in   Thanks

## 2013-09-07 NOTE — Telephone Encounter (Signed)
Patient has appointment for a physical on 10/22/13.  She wants to go to [place] to have her lab work done.  Please put orders in computer.  Patient said she'll go the week before her appointment.

## 2013-09-07 NOTE — Addendum Note (Signed)
Addended by: Tonia Ghent on: 09/07/2013 11:17 PM   Modules accepted: Orders

## 2013-10-16 ENCOUNTER — Other Ambulatory Visit (INDEPENDENT_AMBULATORY_CARE_PROVIDER_SITE_OTHER): Payer: Medicare Other

## 2013-10-16 DIAGNOSIS — I10 Essential (primary) hypertension: Secondary | ICD-10-CM

## 2013-10-16 DIAGNOSIS — M8580 Other specified disorders of bone density and structure, unspecified site: Secondary | ICD-10-CM

## 2013-10-16 LAB — COMPREHENSIVE METABOLIC PANEL
ALT: 26 U/L (ref 0–35)
AST: 26 U/L (ref 0–37)
Albumin: 4.1 g/dL (ref 3.5–5.2)
Alkaline Phosphatase: 104 U/L (ref 39–117)
BILIRUBIN TOTAL: 0.5 mg/dL (ref 0.2–1.2)
BUN: 13 mg/dL (ref 6–23)
CHLORIDE: 102 meq/L (ref 96–112)
CO2: 28 meq/L (ref 19–32)
Calcium: 9.6 mg/dL (ref 8.4–10.5)
Creatinine, Ser: 0.8 mg/dL (ref 0.4–1.2)
GFR: 79.91 mL/min (ref >60.00)
Glucose, Bld: 93 mg/dL (ref 70–99)
Potassium: 4.1 mEq/L (ref 3.5–5.1)
SODIUM: 137 meq/L (ref 135–145)
TOTAL PROTEIN: 7.5 g/dL (ref 6.0–8.3)

## 2013-10-16 LAB — LIPID PANEL
CHOL/HDL RATIO: 3
Cholesterol: 134 mg/dL (ref 0–200)
HDL: 47.4 mg/dL (ref >39.00)
LDL Cholesterol: 65 mg/dL (ref 0–99)
NONHDL: 86.6
Triglycerides: 106 mg/dL (ref 0.0–149.0)
VLDL: 21.2 mg/dL (ref 0.0–40.0)

## 2013-10-17 LAB — VITAMIN D 25 HYDROXY (VIT D DEFICIENCY, FRACTURES): VIT D 25 HYDROXY: 65 ng/mL (ref 30–89)

## 2013-10-22 ENCOUNTER — Encounter: Payer: Self-pay | Admitting: Family Medicine

## 2013-10-22 ENCOUNTER — Ambulatory Visit (INDEPENDENT_AMBULATORY_CARE_PROVIDER_SITE_OTHER): Payer: Medicare Other | Admitting: Family Medicine

## 2013-10-22 VITALS — BP 136/82 | HR 70 | Temp 98.1°F | Ht 62.25 in | Wt 153.0 lb

## 2013-10-22 DIAGNOSIS — Z1211 Encounter for screening for malignant neoplasm of colon: Secondary | ICD-10-CM

## 2013-10-22 DIAGNOSIS — Z0000 Encounter for general adult medical examination without abnormal findings: Secondary | ICD-10-CM

## 2013-10-22 DIAGNOSIS — F411 Generalized anxiety disorder: Secondary | ICD-10-CM

## 2013-10-22 DIAGNOSIS — L659 Nonscarring hair loss, unspecified: Secondary | ICD-10-CM

## 2013-10-22 DIAGNOSIS — I10 Essential (primary) hypertension: Secondary | ICD-10-CM

## 2013-10-22 DIAGNOSIS — Z780 Asymptomatic menopausal state: Secondary | ICD-10-CM

## 2013-10-22 DIAGNOSIS — E7800 Pure hypercholesterolemia, unspecified: Secondary | ICD-10-CM

## 2013-10-22 DIAGNOSIS — Z23 Encounter for immunization: Secondary | ICD-10-CM

## 2013-10-22 MED ORDER — PRAVASTATIN SODIUM 40 MG PO TABS: ORAL_TABLET | ORAL | Status: DC

## 2013-10-22 MED ORDER — CITALOPRAM HYDROBROMIDE 20 MG PO TABS: 20.0000 mg | ORAL_TABLET | Freq: Every day | ORAL | Status: DC

## 2013-10-22 MED ORDER — LUTEIN 10 MG PO TABS: 10.0000 mg | ORAL_TABLET | Freq: Every day | ORAL | Status: DC

## 2013-10-22 MED ORDER — OLMESARTAN 10 MG HALF TABLET: ORAL_TABLET | ORAL | Status: DC

## 2013-10-22 NOTE — Progress Notes (Signed)
Pre visit review using our clinic review tool, if applicable. No additional management support is needed unless otherwise documented below in the visit note.  I have personally reviewed the Medicare Annual Wellness questionnaire and have noted 1. The patient's medical and social history 2. Their use of alcohol, tobacco or illicit drugs 3. Their current medications and supplements 4. The patient's functional ability including ADL's, fall risks, home safety risks and hearing or visual             impairment. 5. Diet and physical activities 6. Evidence for depression or mood disorders  The patients weight, height, BMI have been recorded in the chart and visual acuity is per eye clinic.  I have made referrals, counseling and provided education to the patient based review of the above and I have provided the pt with a written personalized care plan for preventive services.  See scanned forms.  Routine anticipatory guidance given to patient.  See health maintenance. Flu 20214 Shingles 2013 PNA 2010, updated today (2015) Tetanus 2015 D/w patient [id number] for colon cancer screening, including IFOB vs. colonoscopy.  Risks and benefits of both were discussed and patient voiced understanding.  Pt elects LTJ:QZES.  Breast cancer screening- 2014 DXA due. Ordered.  Advance directive- husband designated if patient were incapacitated.   Cognitive function addressed- see scanned forms- and if abnormal then additional documentation follows.   Hypertension:    Using medication without problems or lightheadedness: lightheaded in AM Chest pain with exertion:no Edema:no Short of breath:no Average home BPs: usually lower than today at OV  Elevated Cholesterol: Using medications without problems:yes Muscle aches: no Diet compliance:yes, usually Exercise:some  Anxiety.  Controlled with med, doing well.  Mood is good. Wants to continue med.  No reason to stop.  D/w pt.   PMH and SH reviewed  Meds,  vitals, and allergies reviewed.   ROS: See HPI.  Otherwise negative.    GEN: nad, alert and oriented HEENT: mucous membranes moist NECK: supple w/o LA CV: rrr. PULM: ctab, no inc wob ABD: soft, +bs EXT: no edema SKIN: no acute rash

## 2013-10-22 NOTE — Patient Instructions (Signed)
Go to the lab on the way out.  We'll contact you with your lab report. Cut back to 10mg  of benicar (olmesartan).  If you are still lightheaded, then let us know. We can send in a 5mg  tab if needed.   Rosaria Ferries will call about your referral for the bone density test.  Glad to see you.

## 2013-10-23 ENCOUNTER — Telehealth: Payer: Self-pay

## 2013-10-23 NOTE — Assessment & Plan Note (Signed)
See scanned forms.  Routine anticipatory guidance given to patient.  See health maintenance. Flu 20214 Shingles 2013 PNA 2010, updated today (2015) Tetanus 2015 D/w patient VP:[id number] for colon cancer screening, including IFOB vs. colonoscopy.  Risks and benefits of both were discussed and patient voiced understanding.  Pt elects YEL:YHTM.  Breast cancer screening- 2014 DXA due. Ordered.  Advance directive- husband designated if patient were incapacitated.   Cognitive function addressed- see scanned forms- and if abnormal then additional documentation follows.  She asked about lutein dosing.  10mg /day is reasonable.

## 2013-10-23 NOTE — Assessment & Plan Note (Signed)
D/w pt.  Stabilized per patient report.

## 2013-10-23 NOTE — Assessment & Plan Note (Signed)
Controlled, continue as is.  Doing well.

## 2013-10-23 NOTE — Assessment & Plan Note (Signed)
Stop HCTZ, continue just ARB, may need to cut to 5mg  a day from 10 if still lightheaded.  She'll notify me.  Okay for outpatient f/u.  Labs d/w pt.

## 2013-10-23 NOTE — Assessment & Plan Note (Signed)
Continue statin, labs d/w pt.

## 2013-10-23 NOTE — Telephone Encounter (Signed)
erin with [facility] left v/m requesting cb; I called pharmacy and was told to excuse call, erin had figured out her question.

## 2013-11-08 ENCOUNTER — Encounter: Payer: Self-pay | Admitting: Family Medicine

## 2013-11-12 ENCOUNTER — Ambulatory Visit (INDEPENDENT_AMBULATORY_CARE_PROVIDER_SITE_OTHER)
Admission: RE | Admit: 2013-11-12 | Discharge: 2013-11-12 | Disposition: A | Discharge status: Home / Self Care | Payer: Medicare Other | Source: Ambulatory Visit | Attending: Family Medicine | Admitting: Family Medicine

## 2013-11-12 DIAGNOSIS — Z780 Asymptomatic menopausal state: Secondary | ICD-10-CM

## 2014-03-08 ENCOUNTER — Encounter: Payer: Self-pay | Admitting: Family Medicine

## 2014-05-03 ENCOUNTER — Encounter: Payer: Self-pay | Admitting: Family Medicine

## 2014-05-04 ENCOUNTER — Encounter: Payer: Self-pay | Admitting: Gynecology

## 2014-05-12 ENCOUNTER — Encounter: Payer: Self-pay | Admitting: Gynecology

## 2014-09-22 ENCOUNTER — Other Ambulatory Visit: Payer: Self-pay | Admitting: Family Medicine

## 2014-11-18 ENCOUNTER — Other Ambulatory Visit: Payer: Self-pay | Admitting: Family Medicine

## 2014-12-30 ENCOUNTER — Encounter: Payer: Self-pay | Admitting: Family Medicine

## 2015-01-02 ENCOUNTER — Other Ambulatory Visit: Payer: Self-pay | Admitting: Family Medicine

## 2015-01-02 DIAGNOSIS — E7800 Pure hypercholesterolemia, unspecified: Secondary | ICD-10-CM

## 2015-01-02 DIAGNOSIS — M8580 Other specified disorders of bone density and structure, unspecified site: Secondary | ICD-10-CM

## 2015-01-04 ENCOUNTER — Other Ambulatory Visit (INDEPENDENT_AMBULATORY_CARE_PROVIDER_SITE_OTHER): Payer: Medicare Other

## 2015-01-04 DIAGNOSIS — M8580 Other specified disorders of bone density and structure, unspecified site: Secondary | ICD-10-CM

## 2015-01-04 DIAGNOSIS — E7800 Pure hypercholesterolemia, unspecified: Secondary | ICD-10-CM

## 2015-01-04 LAB — COMPREHENSIVE METABOLIC PANEL
ALBUMIN: 4.2 g/dL (ref 3.5–5.2)
ALT: 26 U/L (ref 0–35)
AST: 21 U/L (ref 0–37)
Alkaline Phosphatase: 125 U/L — ABNORMAL HIGH (ref 39–117)
BUN: 14 mg/dL (ref 6–23)
CHLORIDE: 103 meq/L (ref 96–112)
CO2: 28 meq/L (ref 19–32)
CREATININE: 0.78 mg/dL (ref 0.40–1.20)
Calcium: 9.5 mg/dL (ref 8.4–10.5)
GFR: 77.28 mL/min (ref >60.00)
Glucose, Bld: 91 mg/dL (ref 70–99)
POTASSIUM: 4.1 meq/L (ref 3.5–5.1)
SODIUM: 138 meq/L (ref 135–145)
Total Bilirubin: 0.6 mg/dL (ref 0.2–1.2)
Total Protein: 7.5 g/dL (ref 6.0–8.3)

## 2015-01-04 LAB — LIPID PANEL
CHOL/HDL RATIO: 3
Cholesterol: 138 mg/dL (ref 0–200)
HDL: 42.7 mg/dL (ref >39.00)
LDL CALC: 73 mg/dL (ref 0–99)
NONHDL: 95.4
Triglycerides: 110 mg/dL (ref 0.0–149.0)
VLDL: 22 mg/dL (ref 0.0–40.0)

## 2015-01-04 LAB — VITAMIN D 25 HYDROXY (VIT D DEFICIENCY, FRACTURES): VITD: 39.12 ng/mL (ref 30.00–100.00)

## 2015-01-11 ENCOUNTER — Ambulatory Visit (INDEPENDENT_AMBULATORY_CARE_PROVIDER_SITE_OTHER): Payer: Medicare Other | Admitting: Family Medicine

## 2015-01-11 ENCOUNTER — Encounter: Payer: Self-pay | Admitting: Family Medicine

## 2015-01-11 VITALS — BP 158/80 | HR 82 | Temp 97.9°F | Ht 62.0 in | Wt 158.5 lb

## 2015-01-11 DIAGNOSIS — Z119 Encounter for screening for infectious and parasitic diseases, unspecified: Secondary | ICD-10-CM

## 2015-01-11 DIAGNOSIS — Z0000 Encounter for general adult medical examination without abnormal findings: Secondary | ICD-10-CM | POA: Diagnosis not present

## 2015-01-11 DIAGNOSIS — E7800 Pure hypercholesterolemia, unspecified: Secondary | ICD-10-CM

## 2015-01-11 DIAGNOSIS — I10 Essential (primary) hypertension: Secondary | ICD-10-CM

## 2015-01-11 DIAGNOSIS — Z7189 Other specified counseling: Secondary | ICD-10-CM

## 2015-01-11 DIAGNOSIS — F411 Generalized anxiety disorder: Secondary | ICD-10-CM | POA: Diagnosis not present

## 2015-01-11 DIAGNOSIS — Z1211 Encounter for screening for malignant neoplasm of colon: Secondary | ICD-10-CM

## 2015-01-11 MED ORDER — CITALOPRAM HYDROBROMIDE 20 MG PO TABS: 30.0000 mg | ORAL_TABLET | Freq: Every day | ORAL | Status: DC

## 2015-01-11 MED ORDER — PRAVASTATIN SODIUM 40 MG PO TABS: 40.0000 mg | ORAL_TABLET | Freq: Every day | ORAL | Status: DC

## 2015-01-11 MED ORDER — OLMESARTAN MEDOXOMIL 5 MG PO TABS: 5.0000 mg | ORAL_TABLET | Freq: Every day | ORAL | Status: DC

## 2015-01-11 NOTE — Patient Instructions (Addendum)
Call about getting seen at Dr. Kennith Maes clinic, [place].  [phone].  Try 1.5 tabs of celexa.  Update me as needed.  Go to the lab on the way out.  We'll contact you with your lab report. I would get a flu shot each fall.   Take care. Glad to see you.

## 2015-01-11 NOTE — Progress Notes (Signed)
Pre visit review using our clinic review tool, if applicable. No additional management support is needed unless otherwise documented below in the visit note.  I have personally reviewed the Medicare Annual Wellness questionnaire and have noted 1. The patient's medical and social history 2. Their use of alcohol, tobacco or illicit drugs 3. Their current medications and supplements 4. The patient's functional ability including ADL's, fall risks, home safety risks and hearing or visual             impairment. 5. Diet and physical activities 6. Evidence for depression or mood disorders  The patients weight, height, BMI have been recorded in the chart and visual acuity is per eye clinic.  I have made referrals, counseling and provided education to the patient based review of the above and I have provided the pt with a written personalized care plan for preventive services.  Provider list updated- see scanned forms.  Routine anticipatory guidance given to patient.  See health maintenance.  Flu encouraged, she'll get at pharmacy later on this fall.  Shingles 2013 PNA 2015, prev dose clarified with patient and health maintenance updated.  Tetanus 2015 D/w patient [id number] for colon cancer screening, including IFOB vs. colonoscopy.  Risks and benefits of both were discussed and patient voiced understanding.  Pt elects ASN:KNLZ.  Breast cancer screening- mammogram 2016 DXA 2015, repeat not due yet in 2016.  Advance directive- husband designated if patient were incapacitated.  Cognitive function addressed- see scanned forms- and if abnormal then additional documentation follows.  Pt opts in for HCV screening with next set of labs.  D/w pt re: routine screening.     Elevated Cholesterol: Using medications without problems:yes Muscle aches: no Diet compliance:yes Exercise:yes Other complaints: d/w pt.  At this point, reasonable to continue statin.  Labs d/w pt.  Minimal inc in alk phos, likely  insignificant, d/w pt.   Hypertension:    Using medication without problems or lightheadedness: yes Chest pain with exertion:no Edema:no Short of breath:no Average home BPs: usually controlled at home check.   Anxiety.  She has had family upheaval (son getting divorced), but is still at home.  No ADE on med.  Less effect from med compared to prev.  D/w pt about possible dose inc, she agreed to that.    PMH and SH reviewed  Meds, vitals, and allergies reviewed.   ROS: See HPI.  Otherwise negative.    GEN: nad, alert and oriented HEENT: mucous membranes moist NECK: supple w/o LA CV: rrr. PULM: ctab, no inc wob ABD: soft, +bs EXT: no edema SKIN: no acute rash Speech and affect wnl.

## 2015-01-12 DIAGNOSIS — Z7189 Other specified counseling: Secondary | ICD-10-CM | POA: Insufficient documentation

## 2015-01-12 NOTE — Assessment & Plan Note (Signed)
Flu encouraged, she'll get at pharmacy later on this fall.  Shingles 2013 PNA 2015, prev dose clarified with patient and health maintenance updated.  Tetanus 2015 D/w patient [id number] for colon cancer screening, including IFOB vs. colonoscopy.  Risks and benefits of both were discussed and patient voiced understanding.  Pt elects DJS:HFWY.  Breast cancer screening- mammogram 2016 DXA 2015, repeat not due yet in 2016.  Advance directive- husband designated if patient were incapacitated.  Cognitive function addressed- see scanned forms- and if abnormal then additional documentation follows.  Pt opts in for HCV screening with next set of labs.  D/w pt re: routine screening.

## 2015-01-12 NOTE — Assessment & Plan Note (Signed)
No ADE on med. Less effect from med compared to prev. D/w pt about possible dose inc, she agreed to that.  Will inc celexa to 30mg  a day.  She'll update me as needed.  No SI/HI.  Okay for outpatient f/u.

## 2015-01-12 NOTE — Assessment & Plan Note (Signed)
Advance directive- husband designated if patient were incapacitated.  

## 2015-01-12 NOTE — Assessment & Plan Note (Signed)
Continue as is.  Controlled on home checks.  No ADE on med.  Continue diet and exercise.  She agrees.

## 2015-01-12 NOTE — Assessment & Plan Note (Signed)
Reasonable to continue statin as is.  She agrees.  No ADE on med.  Continue diet and exercise.

## 2015-02-01 ENCOUNTER — Encounter: Payer: Self-pay | Admitting: Family Medicine

## 2015-03-25 ENCOUNTER — Other Ambulatory Visit: Payer: Self-pay | Admitting: Family Medicine

## 2015-03-25 ENCOUNTER — Encounter: Payer: Self-pay | Admitting: Family Medicine

## 2015-03-25 MED ORDER — OLMESARTAN MEDOXOMIL 5 MG PO TABS: 5.0000 mg | ORAL_TABLET | Freq: Two times a day (BID) | ORAL | Status: DC

## 2015-06-29 DIAGNOSIS — Z1231 Encounter for screening mammogram for malignant neoplasm of breast: Secondary | ICD-10-CM | POA: Diagnosis not present

## 2015-06-29 DIAGNOSIS — R922 Inconclusive mammogram: Secondary | ICD-10-CM | POA: Diagnosis not present

## 2015-06-29 DIAGNOSIS — R928 Other abnormal and inconclusive findings on diagnostic imaging of breast: Secondary | ICD-10-CM | POA: Diagnosis not present

## 2015-06-30 ENCOUNTER — Encounter: Payer: Self-pay | Admitting: Gynecology

## 2015-09-12 DIAGNOSIS — L821 Other seborrheic keratosis: Secondary | ICD-10-CM | POA: Diagnosis not present

## 2015-09-12 DIAGNOSIS — L570 Actinic keratosis: Secondary | ICD-10-CM | POA: Diagnosis not present

## 2015-09-12 DIAGNOSIS — Z01419 Encounter for gynecological examination (general) (routine) without abnormal findings: Secondary | ICD-10-CM | POA: Diagnosis not present

## 2015-09-12 DIAGNOSIS — D239 Other benign neoplasm of skin, unspecified: Secondary | ICD-10-CM | POA: Diagnosis not present

## 2015-09-20 DIAGNOSIS — M8589 Other specified disorders of bone density and structure, multiple sites: Secondary | ICD-10-CM | POA: Diagnosis not present

## 2015-09-20 DIAGNOSIS — Z780 Asymptomatic menopausal state: Secondary | ICD-10-CM | POA: Diagnosis not present

## 2015-12-29 DIAGNOSIS — N6001 Solitary cyst of right breast: Secondary | ICD-10-CM | POA: Diagnosis not present

## 2016-01-02 ENCOUNTER — Other Ambulatory Visit: Payer: Self-pay | Admitting: Family Medicine

## 2016-01-16 ENCOUNTER — Telehealth: Payer: Self-pay | Admitting: Family Medicine

## 2016-01-16 DIAGNOSIS — Z119 Encounter for screening for infectious and parasitic diseases, unspecified: Secondary | ICD-10-CM

## 2016-01-16 DIAGNOSIS — M8580 Other specified disorders of bone density and structure, unspecified site: Secondary | ICD-10-CM

## 2016-01-16 DIAGNOSIS — I10 Essential (primary) hypertension: Secondary | ICD-10-CM

## 2016-01-16 NOTE — Telephone Encounter (Signed)
Patient is coming in for a medicare wellness exam on 02/14/16.  Patient would like to have her lab work done at the [place] office before the appointment.  Please put in lab order.

## 2016-01-17 NOTE — Telephone Encounter (Signed)
Patient notified as instructed by telephone that lab order has been put in.

## 2016-01-17 NOTE — Telephone Encounter (Signed)
Ordered. Thanks

## 2016-02-14 ENCOUNTER — Ambulatory Visit: Payer: Self-pay | Admitting: Family Medicine

## 2016-02-23 ENCOUNTER — Other Ambulatory Visit (INDEPENDENT_AMBULATORY_CARE_PROVIDER_SITE_OTHER): Payer: Medicare Other

## 2016-02-23 DIAGNOSIS — Z119 Encounter for screening for infectious and parasitic diseases, unspecified: Secondary | ICD-10-CM | POA: Diagnosis not present

## 2016-02-23 DIAGNOSIS — I10 Essential (primary) hypertension: Secondary | ICD-10-CM | POA: Diagnosis not present

## 2016-02-23 DIAGNOSIS — M8580 Other specified disorders of bone density and structure, unspecified site: Secondary | ICD-10-CM | POA: Diagnosis not present

## 2016-02-23 LAB — COMPREHENSIVE METABOLIC PANEL
ALBUMIN: 4.3 g/dL (ref 3.5–5.2)
ALK PHOS: 109 U/L (ref 39–117)
ALT: 26 U/L (ref 0–35)
AST: 20 U/L (ref 0–37)
BUN: 17 mg/dL (ref 6–23)
CALCIUM: 9.7 mg/dL (ref 8.4–10.5)
CHLORIDE: 103 meq/L (ref 96–112)
CO2: 26 mEq/L (ref 19–32)
Creatinine, Ser: 0.88 mg/dL (ref 0.40–1.20)
GFR: 67.02 mL/min (ref >60.00)
Glucose, Bld: 98 mg/dL (ref 70–99)
POTASSIUM: 4.1 meq/L (ref 3.5–5.1)
Sodium: 138 mEq/L (ref 135–145)
TOTAL PROTEIN: 7.7 g/dL (ref 6.0–8.3)
Total Bilirubin: 0.7 mg/dL (ref 0.2–1.2)

## 2016-02-23 LAB — LIPID PANEL
CHOL/HDL RATIO: 3
Cholesterol: 144 mg/dL (ref 0–200)
HDL: 42.8 mg/dL (ref >39.00)
LDL Cholesterol: 77 mg/dL (ref 0–99)
NONHDL: 100.85
Triglycerides: 120 mg/dL (ref 0.0–149.0)
VLDL: 24 mg/dL (ref 0.0–40.0)

## 2016-02-23 LAB — VITAMIN D 25 HYDROXY (VIT D DEFICIENCY, FRACTURES): VITD: 36.65 ng/mL (ref 30.00–100.00)

## 2016-02-23 LAB — HEPATITIS C ANTIBODY: HCV AB: NEGATIVE

## 2016-02-28 ENCOUNTER — Ambulatory Visit (INDEPENDENT_AMBULATORY_CARE_PROVIDER_SITE_OTHER): Payer: Medicare Other | Admitting: Family Medicine

## 2016-02-28 ENCOUNTER — Encounter: Payer: Self-pay | Admitting: Family Medicine

## 2016-02-28 VITALS — BP 162/86 | HR 94 | Temp 97.7°F | Ht 62.0 in | Wt 168.2 lb

## 2016-02-28 DIAGNOSIS — E7800 Pure hypercholesterolemia, unspecified: Secondary | ICD-10-CM

## 2016-02-28 DIAGNOSIS — I10 Essential (primary) hypertension: Secondary | ICD-10-CM

## 2016-02-28 DIAGNOSIS — Z0000 Encounter for general adult medical examination without abnormal findings: Secondary | ICD-10-CM | POA: Diagnosis not present

## 2016-02-28 DIAGNOSIS — F411 Generalized anxiety disorder: Secondary | ICD-10-CM

## 2016-02-28 DIAGNOSIS — Z1211 Encounter for screening for malignant neoplasm of colon: Secondary | ICD-10-CM

## 2016-02-28 MED ORDER — WELLBUTRIN SR 100 MG PO TB12: 100.0000 mg | ORAL_TABLET | Freq: Every day | ORAL | 3 refills | Status: DC

## 2016-02-28 MED ORDER — PRAVASTATIN SODIUM 40 MG PO TABS: ORAL_TABLET | ORAL | 3 refills | Status: DC

## 2016-02-28 MED ORDER — OLMESARTAN MEDOXOMIL 5 MG PO TABS: 5.0000 mg | ORAL_TABLET | Freq: Two times a day (BID) | ORAL | 3 refills | Status: DC

## 2016-02-28 MED ORDER — CITALOPRAM HYDROBROMIDE 20 MG PO TABS: 20.0000 mg | ORAL_TABLET | Freq: Every day | ORAL | 3 refills | Status: DC

## 2016-02-28 NOTE — Progress Notes (Signed)
I have personally reviewed the Medicare Annual Wellness questionnaire and have noted 1. The patient's medical and social history 2. Their use of alcohol, tobacco or illicit drugs 3. Their current medications and supplements 4. The patient's functional ability including ADL's, fall risks, home safety risks and hearing or visual             impairment. 5. Diet and physical activities 6. Evidence for depression or mood disorders  The patients weight, height, BMI have been recorded in the chart and visual acuity is per eye clinic.  I have made referrals, counseling and provided education to the patient based review of the above and I have provided the pt with a written personalized care plan for preventive services.  Provider list updated- see scanned forms.  Routine anticipatory guidance given to patient.  See health maintenance.  Flu done last week Shingles 2013 PNA 2015, prev dose clarified with patient and health maintenance updated.  Tetanus 2015 D/w patient [id number] for colon cancer screening, including IFOB vs. colonoscopy.  Risks and benefits of both were discussed and patient voiced understanding.  Pt elects [id number].  Breast cancer screening- mammogram 2017 DXA 2017 per Dr. Ronita Hipps.  Advance directive- husband designated if patient were incapacitated.  Cognitive function addressed- see scanned forms- and if abnormal then additional documentation follows.  HCV neg, d/w pt.   Anxiety and depression.  Her husband is still dealing with cancer, going through treatment.  She is worried about him.  She is also frustrated about her weight.  She wants to travel with him but that makes her diet more difficult.  She didn't tolerate higher dose of celexa.  No SI/HI.  She has tried counseling w/o effect prev.  She had tried wellbutrin but only got relief with name brand wellbutrin.    Hypertension:    Using medication without problems or lightheadedness: yes Chest pain with exertion:  no Edema:no Short of breath:no  Elevated Cholesterol: Using medications without problems:yes Muscle aches: no Diet compliance:encouraged Exercise: encouraged  PMH and SH reviewed  Meds, vitals, and allergies reviewed.   ROS: Per HPI.  Unless specifically indicated otherwise in HPI, the patient denies:  General: fever. Eyes: acute vision changes ENT: sore throat Cardiovascular: chest pain Respiratory: SOB GI: vomiting GU: dysuria Musculoskeletal: acute back pain Derm: acute rash Neuro: acute motor dysfunction Psych: worsening mood Endocrine: polydipsia Heme: bleeding Allergy: hayfever  GEN: nad, alert and oriented HEENT: mucous membranes moist NECK: supple w/o LA CV: rrr. PULM: ctab, no inc wob ABD: soft, +bs EXT: no edema SKIN: no acute rash

## 2016-02-28 NOTE — Progress Notes (Signed)
Pre visit review using our clinic review tool, if applicable. No additional management support is needed unless otherwise documented below in the visit note. 

## 2016-02-28 NOTE — Patient Instructions (Addendum)
Take care.  Glad to see you.  Update me as needed.  See about adding on wellbutrin in the meantime.   Go to the lab on the way out.  We'll contact you with your lab report.

## 2016-02-29 ENCOUNTER — Other Ambulatory Visit: Payer: Self-pay | Admitting: Family Medicine

## 2016-02-29 ENCOUNTER — Encounter: Payer: Self-pay | Admitting: Family Medicine

## 2016-02-29 MED ORDER — BUPROPION HCL ER (SR) 100 MG PO TB12: 100.0000 mg | ORAL_TABLET | Freq: Every day | ORAL | 3 refills | Status: DC

## 2016-02-29 NOTE — Telephone Encounter (Signed)
[facility] [phone] left v/m; pt requesting generic for wellbutrin SR. [facility] request cb.

## 2016-02-29 NOTE — Assessment & Plan Note (Signed)
Reasonable to continue statin given her age and high blood pressure. Labs discussed with patient. She agrees.

## 2016-02-29 NOTE — Assessment & Plan Note (Signed)
Continue work on diet and exercise. Her blood pressure is been much lower at home with a systolic of 123456 or below. She likely has some whitecoat effect. Continue current medications. She agrees.

## 2016-02-29 NOTE — Assessment & Plan Note (Signed)
Flu done last week Shingles 2013 PNA 2015, prev dose clarified with patient and health maintenance updated.  Tetanus 2015 D/w patient [id number] for colon cancer screening, including IFOB vs. colonoscopy.  Risks and benefits of both were discussed and patient voiced understanding.  Pt elects [id number].  Breast cancer screening- mammogram 2017 DXA 2017 per Dr. Ronita Hipps.  Advance directive- husband designated if patient were incapacitated.  Cognitive function addressed- see scanned forms- and if abnormal then additional documentation follows.  HCV neg, d/w pt.

## 2016-02-29 NOTE — Assessment & Plan Note (Signed)
Discussed with patient about options. She did not want to go to counseling. Likely reasonable to continue citalopram 20 mg a day and add on Wellbutrin once a day. Discussed with patient. She agrees. She will update me as needed. Okay for outpatient follow-up.

## 2016-03-20 ENCOUNTER — Encounter: Payer: Self-pay | Admitting: Family Medicine

## 2016-06-29 DIAGNOSIS — Z1231 Encounter for screening mammogram for malignant neoplasm of breast: Secondary | ICD-10-CM | POA: Diagnosis not present

## 2016-07-03 ENCOUNTER — Encounter: Payer: Self-pay | Admitting: Family Medicine

## 2016-07-04 ENCOUNTER — Encounter: Payer: Self-pay | Admitting: *Deleted

## 2016-07-09 DIAGNOSIS — L821 Other seborrheic keratosis: Secondary | ICD-10-CM | POA: Diagnosis not present

## 2016-07-09 DIAGNOSIS — D229 Melanocytic nevi, unspecified: Secondary | ICD-10-CM | POA: Diagnosis not present

## 2016-07-12 ENCOUNTER — Encounter: Payer: Self-pay | Admitting: Family Medicine

## 2016-08-13 ENCOUNTER — Encounter: Payer: Self-pay | Admitting: Family Medicine

## 2016-08-23 DIAGNOSIS — H353131 Nonexudative age-related macular degeneration, bilateral, early dry stage: Secondary | ICD-10-CM | POA: Diagnosis not present

## 2016-08-23 DIAGNOSIS — H25813 Combined forms of age-related cataract, bilateral: Secondary | ICD-10-CM | POA: Diagnosis not present

## 2016-12-04 DIAGNOSIS — H25813 Combined forms of age-related cataract, bilateral: Secondary | ICD-10-CM | POA: Diagnosis not present

## 2017-01-07 ENCOUNTER — Other Ambulatory Visit: Payer: Self-pay | Admitting: Family Medicine

## 2017-01-11 DIAGNOSIS — D229 Melanocytic nevi, unspecified: Secondary | ICD-10-CM | POA: Diagnosis not present

## 2017-01-11 DIAGNOSIS — L820 Inflamed seborrheic keratosis: Secondary | ICD-10-CM | POA: Diagnosis not present

## 2017-03-02 ENCOUNTER — Encounter: Payer: Self-pay | Admitting: Family Medicine

## 2017-03-06 ENCOUNTER — Other Ambulatory Visit: Payer: Self-pay | Admitting: Family Medicine

## 2017-03-06 DIAGNOSIS — I10 Essential (primary) hypertension: Secondary | ICD-10-CM

## 2017-03-06 DIAGNOSIS — M8580 Other specified disorders of bone density and structure, unspecified site: Secondary | ICD-10-CM

## 2017-03-15 ENCOUNTER — Other Ambulatory Visit (INDEPENDENT_AMBULATORY_CARE_PROVIDER_SITE_OTHER): Payer: Medicare Other

## 2017-03-15 DIAGNOSIS — I10 Essential (primary) hypertension: Secondary | ICD-10-CM

## 2017-03-15 DIAGNOSIS — M8580 Other specified disorders of bone density and structure, unspecified site: Secondary | ICD-10-CM

## 2017-03-15 LAB — COMPREHENSIVE METABOLIC PANEL
ALT: 20 U/L (ref 0–35)
AST: 16 U/L (ref 0–37)
Albumin: 4.2 g/dL (ref 3.5–5.2)
Alkaline Phosphatase: 99 U/L (ref 39–117)
BILIRUBIN TOTAL: 0.5 mg/dL (ref 0.2–1.2)
BUN: 18 mg/dL (ref 6–23)
CALCIUM: 9.6 mg/dL (ref 8.4–10.5)
CHLORIDE: 102 meq/L (ref 96–112)
CO2: 30 meq/L (ref 19–32)
CREATININE: 0.9 mg/dL (ref 0.40–1.20)
GFR: 65.12 mL/min (ref >60.00)
GLUCOSE: 94 mg/dL (ref 70–99)
POTASSIUM: 4 meq/L (ref 3.5–5.1)
Sodium: 139 mEq/L (ref 135–145)
Total Protein: 7.5 g/dL (ref 6.0–8.3)

## 2017-03-15 LAB — LIPID PANEL
CHOL/HDL RATIO: 4
Cholesterol: 164 mg/dL (ref 0–200)
HDL: 41.7 mg/dL (ref >39.00)
LDL CALC: 89 mg/dL (ref 0–99)
NonHDL: 122.47
TRIGLYCERIDES: 165 mg/dL — AB (ref 0.0–149.0)
VLDL: 33 mg/dL (ref 0.0–40.0)

## 2017-03-15 LAB — VITAMIN D 25 HYDROXY (VIT D DEFICIENCY, FRACTURES): VITD: 31.08 ng/mL (ref 30.00–100.00)

## 2017-03-19 ENCOUNTER — Ambulatory Visit (INDEPENDENT_AMBULATORY_CARE_PROVIDER_SITE_OTHER): Payer: Medicare Other | Admitting: Family Medicine

## 2017-03-19 ENCOUNTER — Encounter: Payer: Self-pay | Admitting: Family Medicine

## 2017-03-19 VITALS — BP 170/94 | HR 89 | Temp 98.4°F | Ht 62.0 in | Wt 159.5 lb

## 2017-03-19 DIAGNOSIS — I10 Essential (primary) hypertension: Secondary | ICD-10-CM | POA: Diagnosis not present

## 2017-03-19 DIAGNOSIS — Z7189 Other specified counseling: Secondary | ICD-10-CM

## 2017-03-19 DIAGNOSIS — Z0000 Encounter for general adult medical examination without abnormal findings: Secondary | ICD-10-CM

## 2017-03-19 DIAGNOSIS — F329 Major depressive disorder, single episode, unspecified: Secondary | ICD-10-CM | POA: Diagnosis not present

## 2017-03-19 DIAGNOSIS — F32A Depression, unspecified: Secondary | ICD-10-CM

## 2017-03-19 DIAGNOSIS — E7800 Pure hypercholesterolemia, unspecified: Secondary | ICD-10-CM

## 2017-03-19 MED ORDER — BUPROPION HCL ER (SR) 100 MG PO TB12: 100.0000 mg | ORAL_TABLET | Freq: Every day | ORAL | 3 refills | Status: DC

## 2017-03-19 MED ORDER — OLMESARTAN MEDOXOMIL 5 MG PO TABS: 5.0000 mg | ORAL_TABLET | Freq: Two times a day (BID) | ORAL | 3 refills | Status: DC

## 2017-03-19 MED ORDER — PRAVASTATIN SODIUM 40 MG PO TABS: ORAL_TABLET | ORAL | 3 refills | Status: DC

## 2017-03-19 NOTE — Patient Instructions (Signed)
Take care.  Glad to see you. ?Update me as needed.  ?Don't change your meds for now.  ?

## 2017-03-19 NOTE — Progress Notes (Signed)
I have personally reviewed the Medicare Annual Wellness questionnaire and have noted 1. The patient's medical and social history 2. Their use of alcohol, tobacco or illicit drugs 3. Their current medications and supplements 4. The patient's functional ability including ADL's, fall risks, home safety risks and hearing or visual             impairment. 5. Diet and physical activities 6. Evidence for depression or mood disorders  The patients weight, height, BMI have been recorded in the chart and visual acuity is per eye clinic.  I have made referrals, counseling and provided education to the patient based review of the above and I have provided the pt with a written personalized care plan for preventive services.  Provider list updated- see scanned forms.  Routine anticipatory guidance given to patient.  See health maintenance. The possibility exists that previously documented standard health maintenance information may have been brought forward from a previous encounter into this note.  If needed, that same information has been updated to reflect the current situation based on today's encounter.    Flu done 02/2017 at pharmacy.   Shingles 2013 PNA 2015 Tetanus 2015 Colon cancer screening declined, d/w pt. encouraged.  She is aware of the benefit in mortality rate with colon cancer screening. Breast cancer screening- mammogram 2017 DXA 2017 per Dr. Ronita Hipps.  Advance directive- husband designated if patient were incapacitated.  Cognitive function addressed- see scanned forms- and if abnormal then additional documentation follows.  HCV neg, d/w pt.   Mood d/w pt.  More irritable on BID wellbutrin.  She can tolerate 1 tab a day.  Off citalopram and she didn't want to restart.  She had difficulty with tapering off medicine with nausea.  No SI/HI.  Her husband has cancer treatment ongoing but this has been a chronic stressor.  Her husband asked her to buy two cemetary plots.  She is trying to work  through the chronic strain associated with his illness.  Discussed.  We may end up needing to alter her psychotropic medications but she is not at the point of wanting to do so yet.  Hypertension:    Using medication without problems or lightheadedness: yes Chest pain with exertion:no Edema:no Short of breath:no Average home BPs: controlled at home.  115/65 on home check today, prior to office visit.  Elevated Cholesterol: Using medications without problems: yes Muscle aches: no Diet compliance:yes Exercise: yes, as tolerated.   Intentional weight loss noted.   PMH and SH reviewed  Meds, vitals, and allergies reviewed.   ROS: Per HPI.  Unless specifically indicated otherwise in HPI, the patient denies:  General: fever. Eyes: acute vision changes ENT: sore throat Cardiovascular: chest pain Respiratory: SOB GI: vomiting GU: dysuria Musculoskeletal: acute back pain Derm: acute rash Neuro: acute motor dysfunction Psych: worsening mood Endocrine: polydipsia Heme: bleeding Allergy: hayfever  GEN: nad, alert and oriented, tearful discussing her husband's illness and situation but she regains her composure. HEENT: mucous membranes moist NECK: supple w/o LA CV: rrr. PULM: ctab, no inc wob ABD: soft, +bs EXT: no edema SKIN: no acute rash

## 2017-03-20 NOTE — Assessment & Plan Note (Signed)
Diet and exercise discussed with patient.  Continue statin.  No change in meds.  Labs discussed with patient.  She agrees.

## 2017-03-20 NOTE — Assessment & Plan Note (Signed)
Husband designated if patient were incapacitated. 

## 2017-03-20 NOTE — Assessment & Plan Note (Signed)
More irritable on BID wellbutrin.  She can tolerate 1 tab a day.  Off citalopram and she didn't want to restart.  She had difficulty with tapering off medicine with nausea.  No SI/HI.  Her husband has cancer treatment ongoing but this has been a chronic stressor.  Her husband asked her to buy two cemetary plots.  She is trying to work through the chronic strain associated with his illness.  Discussed.  We may end up needing to alter her psychotropic medications but she is not at the point of wanting to do so yet.

## 2017-03-20 NOTE — Assessment & Plan Note (Signed)
Controlled on home checks.  No change in meds.  Labs discussed with patient.  She agrees.

## 2017-03-20 NOTE — Assessment & Plan Note (Signed)
Flu done 02/2017 at pharmacy.   Shingles 2013 PNA 2015 Tetanus 2015 Colon cancer screening declined, d/w pt. encouraged.  She is aware of the benefit in mortality rate with colon cancer screening. Breast cancer screening- mammogram 2017 DXA 2017 per Dr. Ronita Hipps.  Advance directive- husband designated if patient were incapacitated.  Cognitive function addressed- see scanned forms- and if abnormal then additional documentation follows.  HCV neg, d/w pt.

## 2017-05-20 ENCOUNTER — Encounter: Payer: Self-pay | Admitting: Family Medicine

## 2017-05-22 ENCOUNTER — Encounter: Payer: Self-pay | Admitting: Family Medicine

## 2017-06-05 ENCOUNTER — Telehealth: Payer: Self-pay | Admitting: Family Medicine

## 2017-06-05 NOTE — Telephone Encounter (Signed)
Patient notified as instructed by telephone and verbalized understanding. 

## 2017-06-05 NOTE — Telephone Encounter (Signed)
Copied from [place] [id number]. Topic: Quick Communication - See Telephone Encounter >> Jun 05, 2017  3:45 PM Percell Belt A wrote: CRM for notification. See Telephone encounter for: pt called and said that hospic has told her that it will not be long for her husband and would like to know if she can increase her bp meds temporarily .  She said that she can just tell it is high, she has not taken it   Best number [phone]  06/05/17.

## 2017-06-05 NOTE — Telephone Encounter (Signed)
I would encourage her to try to check her BP.  Would be okay to inc olmesartan from 5mg  (1 tab) twice a day to 1.5 or 2 tabs twice a day if her BP is consistently >140/>90. Update me as needed.   Thanks.  I wish her and her husband the best.

## 2017-06-08 ENCOUNTER — Encounter: Payer: Self-pay | Admitting: Family Medicine

## 2017-06-10 ENCOUNTER — Telehealth: Payer: Self-pay | Admitting: Family Medicine

## 2017-06-10 ENCOUNTER — Encounter: Payer: Self-pay | Admitting: Family Medicine

## 2017-06-10 NOTE — Telephone Encounter (Signed)
Called pt re: husband's death.  Condolences offered.   She thanked me.  We talked about her situation and BP, she'll update me as needed.  She has plans to taper her BP med some after the initial stress of the situation is resolved.

## 2017-08-16 DIAGNOSIS — Z1231 Encounter for screening mammogram for malignant neoplasm of breast: Secondary | ICD-10-CM | POA: Diagnosis not present

## 2017-08-16 DIAGNOSIS — Z01419 Encounter for gynecological examination (general) (routine) without abnormal findings: Secondary | ICD-10-CM | POA: Diagnosis not present

## 2017-08-16 LAB — HM MAMMOGRAPHY

## 2017-09-16 DIAGNOSIS — D229 Melanocytic nevi, unspecified: Secondary | ICD-10-CM | POA: Diagnosis not present

## 2017-09-16 DIAGNOSIS — L820 Inflamed seborrheic keratosis: Secondary | ICD-10-CM | POA: Diagnosis not present

## 2017-09-24 DIAGNOSIS — M8589 Other specified disorders of bone density and structure, multiple sites: Secondary | ICD-10-CM | POA: Diagnosis not present

## 2017-09-24 LAB — HM DEXA SCAN

## 2017-10-28 ENCOUNTER — Other Ambulatory Visit: Payer: Self-pay

## 2017-10-28 NOTE — Patient Outreach (Signed)
[facility]) Care Management  10/28/2017  [name] [birth year] [id number]   Medication Adherence call to Mrs. Sheila Nguyen spoke with patient she did not want to engage and hung up the phone patient is due on Olmesartan 5 mg.patient is showing past due under [place].  [facility] Management Direct Dial [phone]  Fax [phone] [email or web address]

## 2018-01-14 DIAGNOSIS — H25813 Combined forms of age-related cataract, bilateral: Secondary | ICD-10-CM | POA: Diagnosis not present

## 2018-01-14 DIAGNOSIS — H353131 Nonexudative age-related macular degeneration, bilateral, early dry stage: Secondary | ICD-10-CM | POA: Diagnosis not present

## 2018-01-29 DIAGNOSIS — K5229 Other allergic and dietetic gastroenteritis and colitis: Secondary | ICD-10-CM | POA: Diagnosis not present

## 2018-01-29 DIAGNOSIS — T781XXA Other adverse food reactions, not elsewhere classified, initial encounter: Secondary | ICD-10-CM | POA: Diagnosis not present

## 2018-02-27 ENCOUNTER — Telehealth: Payer: Self-pay | Admitting: Family Medicine

## 2018-02-27 DIAGNOSIS — M8580 Other specified disorders of bone density and structure, unspecified site: Secondary | ICD-10-CM

## 2018-02-27 DIAGNOSIS — I10 Essential (primary) hypertension: Secondary | ICD-10-CM

## 2018-02-27 NOTE — Telephone Encounter (Signed)
Copied from [place] [phone]. Topic: Medicare AWV >> Feb 27, 2018  9:58 AM Reyne Dumas L wrote: Reason for CRM:   Pt calling to schedule her AWV.

## 2018-02-27 NOTE — Telephone Encounter (Signed)
Scheduled pt for annual phy on 04/21/18. Pt usually gets labs done prior at [place] office. Please put lab orders in for pt.

## 2018-02-28 NOTE — Telephone Encounter (Signed)
Ordered. Thanks

## 2018-02-28 NOTE — Telephone Encounter (Signed)
Patient advised.

## 2018-04-17 ENCOUNTER — Other Ambulatory Visit (INDEPENDENT_AMBULATORY_CARE_PROVIDER_SITE_OTHER): Payer: Medicare Other

## 2018-04-17 DIAGNOSIS — M8580 Other specified disorders of bone density and structure, unspecified site: Secondary | ICD-10-CM | POA: Diagnosis not present

## 2018-04-17 DIAGNOSIS — I10 Essential (primary) hypertension: Secondary | ICD-10-CM

## 2018-04-17 LAB — COMPREHENSIVE METABOLIC PANEL
ALT: 23 U/L (ref 0–35)
AST: 17 U/L (ref 0–37)
Albumin: 4.2 g/dL (ref 3.5–5.2)
Alkaline Phosphatase: 103 U/L (ref 39–117)
BUN: 13 mg/dL (ref 6–23)
CALCIUM: 9.4 mg/dL (ref 8.4–10.5)
CO2: 28 mEq/L (ref 19–32)
CREATININE: 0.8 mg/dL (ref 0.40–1.20)
Chloride: 106 mEq/L (ref 96–112)
GFR: 74.37 mL/min (ref >60.00)
GLUCOSE: 97 mg/dL (ref 70–99)
Potassium: 4.2 mEq/L (ref 3.5–5.1)
Sodium: 142 mEq/L (ref 135–145)
TOTAL PROTEIN: 7.2 g/dL (ref 6.0–8.3)
Total Bilirubin: 0.5 mg/dL (ref 0.2–1.2)

## 2018-04-17 LAB — LIPID PANEL
Cholesterol: 131 mg/dL (ref 0–200)
HDL: 46.8 mg/dL (ref >39.00)
LDL CALC: 62 mg/dL (ref 0–99)
NonHDL: 84.32
TRIGLYCERIDES: 111 mg/dL (ref 0.0–149.0)
Total CHOL/HDL Ratio: 3
VLDL: 22.2 mg/dL (ref 0.0–40.0)

## 2018-04-17 LAB — VITAMIN D 25 HYDROXY (VIT D DEFICIENCY, FRACTURES): VITD: 42.06 ng/mL (ref 30.00–100.00)

## 2018-04-21 ENCOUNTER — Encounter: Payer: Self-pay | Admitting: Family Medicine

## 2018-04-21 ENCOUNTER — Ambulatory Visit (INDEPENDENT_AMBULATORY_CARE_PROVIDER_SITE_OTHER): Payer: Medicare Other | Admitting: Family Medicine

## 2018-04-21 VITALS — BP 158/80 | HR 84 | Temp 97.9°F | Ht 62.0 in | Wt 152.0 lb

## 2018-04-21 DIAGNOSIS — E7800 Pure hypercholesterolemia, unspecified: Secondary | ICD-10-CM

## 2018-04-21 DIAGNOSIS — F329 Major depressive disorder, single episode, unspecified: Secondary | ICD-10-CM

## 2018-04-21 DIAGNOSIS — Z0000 Encounter for general adult medical examination without abnormal findings: Secondary | ICD-10-CM | POA: Diagnosis not present

## 2018-04-21 DIAGNOSIS — Z7189 Other specified counseling: Secondary | ICD-10-CM | POA: Diagnosis not present

## 2018-04-21 DIAGNOSIS — I10 Essential (primary) hypertension: Secondary | ICD-10-CM

## 2018-04-21 DIAGNOSIS — F32A Depression, unspecified: Secondary | ICD-10-CM

## 2018-04-21 MED ORDER — ALPRAZOLAM 0.25 MG PO TABS: 0.2500 mg | ORAL_TABLET | Freq: Every day | ORAL | 0 refills | Status: DC | PRN

## 2018-04-21 MED ORDER — OLMESARTAN MEDOXOMIL 5 MG PO TABS: 5.0000 mg | ORAL_TABLET | Freq: Every day | ORAL | Status: DC

## 2018-04-21 MED ORDER — OLMESARTAN MEDOXOMIL 5 MG PO TABS: 5.0000 mg | ORAL_TABLET | Freq: Every day | ORAL | 3 refills | Status: DC

## 2018-04-21 MED ORDER — BUPROPION HCL ER (SR) 100 MG PO TB12: 100.0000 mg | ORAL_TABLET | Freq: Every day | ORAL | 3 refills | Status: DC

## 2018-04-21 MED ORDER — PRAVASTATIN SODIUM 40 MG PO TABS: ORAL_TABLET | ORAL | 3 refills | Status: DC

## 2018-04-21 NOTE — Progress Notes (Signed)
I have personally reviewed the Medicare Annual Wellness questionnaire and have noted 1. The patient's medical and social history 2. Their use of alcohol, tobacco or illicit drugs 3. Their current medications and supplements 4. The patient's functional ability including ADL's, fall risks, home safety risks and hearing or visual             impairment. 5. Diet and physical activities 6. Evidence for depression or mood disorders  The patients weight, height, BMI have been recorded in the chart and visual acuity is per eye clinic.  I have made referrals, counseling and provided education to the patient based review of the above and I have provided the pt with a written personalized care plan for preventive services.  Provider list updated- see scanned forms.  Routine anticipatory guidance given to patient.  See health maintenance. The possibility exists that previously documented standard health maintenance information may have been brought forward from a previous encounter into this note.  If needed, that same information has been updated to reflect the current situation based on today's encounter.    Flu done 02/2018 at pharmacy.   Shingles 2013 PNA 2015 Tetanus 2015 Colon cancer screening declined, d/w pt. encouraged.  She can consider cologuard and can update me.   Breast cancer screening- mammogram per gyn.   DXA 2017 per Dr. Ronita Hipps.  Advance directive- son designated if patient were incapacitated.  Cognitive function addressed- see scanned forms- and if abnormal then additional documentation follows.  HCV prev neg, d/w pt.  Mood d/w pt.  Her husband and ex-husband both died this year.  She is going to be with her son at [place], d/w pt.  She has a support group with her friends, most of whom are widows.  She is planning to stay here for now, with splitting some time near her daughter in [place].  No SI/HI but she is dealing with grief.  D/w pt about holidays.  She uses benadryl at night for  sleep, w/o ADE.    She has never been on a plane and was asking about taking xanax prior to a plane trip.  rx done, printed, routine cautions d/w pt.   Elevated Cholesterol: Using medications without problems: yes Muscle aches: no Diet compliance: doing well.  Exercise: doing well.  Labs d/w pt.   Hypertension:    Using medication without problems or lightheadedness: yes Chest pain with exertion:no Edema:no Short of breath:no Average home BPs: SBP 120s at home check, controlled on home checks.   Labs d/w pt.   PMH and SH reviewed  Meds, vitals, and allergies reviewed.   ROS: Per HPI.  Unless specifically indicated otherwise in HPI, the patient denies:  General: fever. Eyes: acute vision changes ENT: sore throat Cardiovascular: chest pain Respiratory: SOB GI: vomiting GU: dysuria Musculoskeletal: acute back pain Derm: acute rash Neuro: acute motor dysfunction Psych: worsening mood Endocrine: polydipsia Heme: bleeding Allergy: hayfever  GEN: nad, alert and oriented HEENT: mucous membranes moist NECK: supple w/o LA CV: rrr. PULM: ctab, no inc wob ABD: soft, +bs EXT: no edema SKIN: no acute rash

## 2018-04-21 NOTE — Patient Instructions (Signed)
Think about getting cologuard done and let me know if you need an order.  Don't change your meds for now.  Take care.  Glad to see you.

## 2018-04-23 NOTE — Assessment & Plan Note (Signed)
Flu done 02/2018 at pharmacy.   Shingles 2013 PNA 2015 Tetanus 2015 Colon cancer screening declined, d/w pt. encouraged.  She can consider cologuard and can update me.   Breast cancer screening- mammogram per gyn.   DXA 2017 per Dr. Ronita Hipps.  Advance directive- son designated if patient were incapacitated.  Cognitive function addressed- see scanned forms- and if abnormal then additional documentation follows.  HCV prev neg, d/w pt.

## 2018-04-23 NOTE — Assessment & Plan Note (Signed)
Controlled on home check.  She likely has a whitecoat component.  No change in meds at this point.  Labs discussed with patient.  She agrees.

## 2018-04-23 NOTE — Assessment & Plan Note (Addendum)
She is dealing with grief.  Wellbutrin helps some.  Continue as is.  She is taken Benadryl at night to sleep when needed.  No adverse effect on that.  We talked about anxiety related to flying.  Prescription given for Xanax.  Routine benzodiazepine cautions given, to use in case she needs it with flight.  No suicidal homicidal intent and still okay for outpatient follow-up.

## 2018-04-23 NOTE — Assessment & Plan Note (Signed)
Reasonable control.  Continue work on diet and exercise.  She agrees.  Labs discussed with patient.

## 2018-05-15 DIAGNOSIS — L820 Inflamed seborrheic keratosis: Secondary | ICD-10-CM | POA: Diagnosis not present

## 2018-05-30 ENCOUNTER — Encounter: Payer: Self-pay | Admitting: Family Medicine

## 2018-09-19 DIAGNOSIS — L820 Inflamed seborrheic keratosis: Secondary | ICD-10-CM | POA: Diagnosis not present

## 2018-09-19 DIAGNOSIS — L821 Other seborrheic keratosis: Secondary | ICD-10-CM | POA: Diagnosis not present

## 2018-11-26 DIAGNOSIS — Z1231 Encounter for screening mammogram for malignant neoplasm of breast: Secondary | ICD-10-CM | POA: Diagnosis not present

## 2018-12-02 DIAGNOSIS — H25813 Combined forms of age-related cataract, bilateral: Secondary | ICD-10-CM | POA: Diagnosis not present

## 2018-12-02 DIAGNOSIS — H353131 Nonexudative age-related macular degeneration, bilateral, early dry stage: Secondary | ICD-10-CM | POA: Diagnosis not present

## 2018-12-13 ENCOUNTER — Encounter: Payer: Self-pay | Admitting: Family Medicine

## 2018-12-15 ENCOUNTER — Telehealth: Payer: Self-pay | Admitting: Family Medicine

## 2018-12-15 ENCOUNTER — Ambulatory Visit (INDEPENDENT_AMBULATORY_CARE_PROVIDER_SITE_OTHER): Payer: Medicare Other | Admitting: Family Medicine

## 2018-12-15 DIAGNOSIS — I10 Essential (primary) hypertension: Secondary | ICD-10-CM | POA: Diagnosis not present

## 2018-12-15 DIAGNOSIS — F419 Anxiety disorder, unspecified: Secondary | ICD-10-CM

## 2018-12-15 MED ORDER — CITALOPRAM HYDROBROMIDE 10 MG PO TABS: 10.0000 mg | ORAL_TABLET | Freq: Every day | ORAL | 1 refills | Status: DC

## 2018-12-15 NOTE — Progress Notes (Signed)
Virtual visit completed through WebEx or similar program Patient location: home  Provider location: [facility] at [facility], office   Pandemic considerations d/w pt.   Limitations and rationale for visit method d/w patient.  Patient agreed to proceed.   CC: BP and anxiety.   HPI:  She had lower BP on recent checks per patient report.  She thought the cuff was still accurate.  She is taking olmesartan 5mg  a day.  She isn't lightheaded.  Would be reasonable to stop and have her check her BP.  No CP.    "I've always been nervious my whole life.  She had a mammogram a few weeks ago and was shaking from worry about getting good results."  She got a good report and then had a few good days, then had return of generalized anxiety.  She doesn't feel depressed.  No SI/HI.  She hasn't used xanax, as she had only used that with flying.  We talked about her home situation.  She is trying to get out in a safe fashion.  Nights are more difficult for patient, being at home alone. She has supportive friends.  Citalopram helped prev.    Pandemic considerations d/w pt.    Meds and allergies reviewed.   ROS: Per HPI unless specifically indicated in ROS section   NAD Speech wnl  A/P: Anxiety.  Restart citalopram at 10mg .  Continue Wellbutrin as is for now.  She agrees.  Still okay for outpatient follow-up.  History of hypertension.  Lower blood pressure recently noted.  Stop olmesartan .  Update me in about 14 days, sooner if needed.  She can check her blood pressure in the meantime.  1min call.

## 2018-12-15 NOTE — Telephone Encounter (Signed)
Noted. Thanks.

## 2018-12-15 NOTE — Telephone Encounter (Signed)
I spoke with pt; pt said for few months has been more anxious; pt taking bupropion 100 mg one daily. Pt has taken Benicar 5 mg this morning at 8 AM and BP now is 135/76 P 75.pt has been resting for 5 mins since walked across room to get BP cuff. Pt will rest prior to virtual appt and have T, BP, P and wt available when CMA calls. Pt scheduled virtual visit 12/15/18 at 12:30. FYI to Dr Damita Dunnings.

## 2018-12-15 NOTE — Telephone Encounter (Addendum)
See mychart message. Please triage patient about her BP and mood and see if we need to set up a phone visit.  That may be the best option.  Thanks.   ============== ----- Message from Sheila Nguyen P to Tonia Ghent, MD sent at 12/13/2018 6:32 PM -----   I think I need to go back to a low dose of celexa. The last few months, I have been irrationally anxious over things that would be only slightly upsetting to most people. Of course, not being able to travel and go out to eat certainly does not help.    I am currently taking one bupropion in the morning.     As nervous as I have been, my pressure has been lower than usual. 109/62, 108/69 before I take my pill, and 92/62, 96/69, etc after I take it. However, I am not inclined to stop taking the blood pressure pill, at least until I get a handle on the anxiety.    What do you recommend?

## 2018-12-17 DIAGNOSIS — F419 Anxiety disorder, unspecified: Secondary | ICD-10-CM | POA: Insufficient documentation

## 2018-12-17 NOTE — Assessment & Plan Note (Signed)
History of hypertension Lower blood pressure recently noted.  Stop olmesartan .  Update me in about 14 days, sooner if needed.  She can check her blood pressure in the meantime.

## 2018-12-17 NOTE — Assessment & Plan Note (Signed)
Restart citalopram at 10mg .  Continue Wellbutrin as is for now.  She agrees.  Still okay for outpatient follow-up.

## 2019-02-12 ENCOUNTER — Encounter: Payer: Self-pay | Admitting: Gynecology

## 2019-04-13 ENCOUNTER — Other Ambulatory Visit: Payer: Self-pay | Admitting: *Deleted

## 2019-04-13 MED ORDER — CITALOPRAM HYDROBROMIDE 10 MG PO TABS: 10.0000 mg | ORAL_TABLET | Freq: Every day | ORAL | 1 refills | Status: DC

## 2019-04-13 MED ORDER — BUPROPION HCL ER (SR) 100 MG PO TB12: 100.0000 mg | ORAL_TABLET | Freq: Every day | ORAL | 1 refills | Status: DC

## 2019-05-12 ENCOUNTER — Other Ambulatory Visit: Payer: Self-pay | Admitting: Family Medicine

## 2019-05-20 ENCOUNTER — Encounter: Payer: Self-pay | Admitting: Family Medicine

## 2019-05-20 MED ORDER — OLMESARTAN MEDOXOMIL 5 MG PO TABS: 5.0000 mg | ORAL_TABLET | Freq: Every day | ORAL | 3 refills | Status: DC

## 2019-05-20 NOTE — Telephone Encounter (Signed)
She reported getting the Moderna vaccine. Please update the chart.  Thanks.

## 2019-05-20 NOTE — Telephone Encounter (Signed)
Chart has been updated.

## 2019-05-20 NOTE — Telephone Encounter (Signed)
Dr Damita Dunnings do you need to see patient for follow up on B/P? I do not see prior request for Benicar refill at this time. Please review

## 2019-05-20 NOTE — Telephone Encounter (Signed)
Patient can check blood pressure at home.  MyChart message sent to patient about follow-up.  Prescription sent.  Thanks.

## 2019-05-20 NOTE — Telephone Encounter (Signed)
I updated flu shot in the chart and asked for patient to let us know which COVID vaccine she received and then I will update. FYI to PCP

## 2019-06-03 ENCOUNTER — Ambulatory Visit: Payer: Medicare Other

## 2019-06-16 DIAGNOSIS — H25813 Combined forms of age-related cataract, bilateral: Secondary | ICD-10-CM | POA: Diagnosis not present

## 2019-06-16 DIAGNOSIS — H527 Unspecified disorder of refraction: Secondary | ICD-10-CM | POA: Diagnosis not present

## 2019-06-16 DIAGNOSIS — H353131 Nonexudative age-related macular degeneration, bilateral, early dry stage: Secondary | ICD-10-CM | POA: Diagnosis not present

## 2019-08-28 ENCOUNTER — Other Ambulatory Visit: Payer: Self-pay

## 2019-08-28 ENCOUNTER — Ambulatory Visit: Payer: Medicare Other | Admitting: Physician Assistant

## 2019-08-28 ENCOUNTER — Encounter: Payer: Self-pay | Admitting: Physician Assistant

## 2019-08-28 DIAGNOSIS — L820 Inflamed seborrheic keratosis: Secondary | ICD-10-CM | POA: Diagnosis not present

## 2019-08-28 DIAGNOSIS — D485 Neoplasm of uncertain behavior of skin: Secondary | ICD-10-CM

## 2019-08-28 NOTE — Progress Notes (Signed)
   Follow up Visit  Subjective  Sheila Nguyen is a 74 y.o. female who presents for the following: Annual Exam (Patient here today for a skin check.  Patient has a spot on her back that she would like checked.  Patient states that she woke up one month ago and had a blood spot on her night gown, patient denies itching).  About 6 weeks ago had blood on her night gown and sheet from her back. She couldn't see anything there and it didn't hurt. She lives alone. Right lower leg small rough spot. Not sore. May have been frozen in the past. Right eyebrow bump that she keeps picking at. It has been there several months. It doesn't hurt.   Objective  Well appearing patient in no apparent distress; mood and affect are within normal limits.  A full examination was performed including head, eyes, ears, nose, lips, neck, chest, axillae, abdomen, back, buttocks, bilateral upper extremities, bilateral lower extremities, hands, feet, fingers, toes, fingernails, and toenails. All findings within normal limits unless otherwise noted below.   Objective  Mid Back (8), Right Eyebrow: Erythematous stuck-on, waxy papule or plaque.   Objective  Left Upper Back: 6.4 to angioma 4.5 to sk Pink and brown crusted lesion.      Assessment & Plan  Inflamed seborrheic keratosis (9) Mid Back (8); Right Eyebrow  Destruction of lesion - Mid Back, Right Eyebrow Complexity: simple   Destruction method: cryotherapy   Informed consent: discussed and consent obtained   Timeout:  patient name, date of birth, surgical site, and procedure verified Lesion destroyed using liquid nitrogen: Yes   Outcome: patient tolerated procedure well with no complications    Neoplasm of uncertain behavior of skin Left Upper Back  Skin / nail biopsy Type of biopsy: tangential   Procedure prep:  Patient was prepped and draped in usual sterile fashion (Non sterile) Prep type:  Chlorhexidine Anesthesia: the lesion was anesthetized in  a standard fashion   Anesthetic:  1% lidocaine w/ epinephrine 1-100,000 local infiltration Instrument used: flexible razor blade   Hemostasis achieved with: ferric subsulfate   Outcome: patient tolerated procedure well    Specimen 1 - Surgical pathology Differential Diagnosis: atypia Check Margins: No

## 2019-08-28 NOTE — Patient Instructions (Signed)

## 2019-09-04 ENCOUNTER — Other Ambulatory Visit: Payer: Self-pay | Admitting: Physician Assistant

## 2019-09-04 DIAGNOSIS — L821 Other seborrheic keratosis: Secondary | ICD-10-CM | POA: Diagnosis not present

## 2019-09-09 ENCOUNTER — Telehealth: Payer: Self-pay | Admitting: *Deleted

## 2019-09-09 NOTE — Telephone Encounter (Signed)
-----   Message from Arlyss Gandy, PA-C sent at 09/09/2019  8:43 AM EDT ----- Ignore above message. Entered in error. Seb K benign for [facility].

## 2019-09-09 NOTE — Telephone Encounter (Signed)
Pathology results to patient.  °

## 2019-09-09 NOTE — Telephone Encounter (Signed)
-----   Message from Arlyss Gandy, PA-C sent at 09/09/2019  8:43 AM EDT ----- Ignore above message. Entered in error. Seb K benign for [facility].

## 2019-10-01 ENCOUNTER — Other Ambulatory Visit (INDEPENDENT_AMBULATORY_CARE_PROVIDER_SITE_OTHER): Payer: Medicare Other

## 2019-10-01 ENCOUNTER — Other Ambulatory Visit: Payer: Self-pay | Admitting: Family Medicine

## 2019-10-01 DIAGNOSIS — I10 Essential (primary) hypertension: Secondary | ICD-10-CM

## 2019-10-01 DIAGNOSIS — M8580 Other specified disorders of bone density and structure, unspecified site: Secondary | ICD-10-CM

## 2019-10-01 LAB — COMPREHENSIVE METABOLIC PANEL
ALT: 19 U/L (ref 0–35)
AST: 17 U/L (ref 0–37)
Albumin: 4.2 g/dL (ref 3.5–5.2)
Alkaline Phosphatase: 112 U/L (ref 39–117)
BUN: 15 mg/dL (ref 6–23)
CO2: 26 mEq/L (ref 19–32)
Calcium: 9.3 mg/dL (ref 8.4–10.5)
Chloride: 104 mEq/L (ref 96–112)
Creatinine, Ser: 0.81 mg/dL (ref 0.40–1.20)
GFR: 68.71 mL/min (ref >60.00)
Glucose, Bld: 96 mg/dL (ref 70–99)
Potassium: 4 mEq/L (ref 3.5–5.1)
Sodium: 138 mEq/L (ref 135–145)
Total Bilirubin: 0.5 mg/dL (ref 0.2–1.2)
Total Protein: 7.1 g/dL (ref 6.0–8.3)

## 2019-10-01 LAB — LIPID PANEL
Cholesterol: 147 mg/dL (ref 0–200)
HDL: 44.1 mg/dL (ref >39.00)
LDL Cholesterol: 73 mg/dL (ref 0–99)
NonHDL: 102.68
Total CHOL/HDL Ratio: 3
Triglycerides: 147 mg/dL (ref 0.0–149.0)
VLDL: 29.4 mg/dL (ref 0.0–40.0)

## 2019-10-01 LAB — VITAMIN D 25 HYDROXY (VIT D DEFICIENCY, FRACTURES): VITD: 37.52 ng/mL (ref 30.00–100.00)

## 2019-10-06 ENCOUNTER — Ambulatory Visit (INDEPENDENT_AMBULATORY_CARE_PROVIDER_SITE_OTHER): Payer: Medicare Other | Admitting: Family Medicine

## 2019-10-06 ENCOUNTER — Other Ambulatory Visit: Payer: Self-pay

## 2019-10-06 ENCOUNTER — Encounter: Payer: Self-pay | Admitting: Family Medicine

## 2019-10-06 VITALS — BP 158/82 | HR 82 | Temp 97.0°F | Ht 62.0 in | Wt 166.5 lb

## 2019-10-06 DIAGNOSIS — E7800 Pure hypercholesterolemia, unspecified: Secondary | ICD-10-CM | POA: Diagnosis not present

## 2019-10-06 DIAGNOSIS — F329 Major depressive disorder, single episode, unspecified: Secondary | ICD-10-CM

## 2019-10-06 DIAGNOSIS — Z0000 Encounter for general adult medical examination without abnormal findings: Secondary | ICD-10-CM | POA: Diagnosis not present

## 2019-10-06 DIAGNOSIS — F32A Depression, unspecified: Secondary | ICD-10-CM

## 2019-10-06 DIAGNOSIS — I10 Essential (primary) hypertension: Secondary | ICD-10-CM | POA: Diagnosis not present

## 2019-10-06 DIAGNOSIS — Z7189 Other specified counseling: Secondary | ICD-10-CM

## 2019-10-06 MED ORDER — PRAVASTATIN SODIUM 40 MG PO TABS: ORAL_TABLET | ORAL | 3 refills | Status: DC

## 2019-10-06 MED ORDER — BUPROPION HCL ER (SR) 100 MG PO TB12: 100.0000 mg | ORAL_TABLET | Freq: Every day | ORAL | 3 refills | Status: DC

## 2019-10-06 MED ORDER — ALPRAZOLAM 0.25 MG PO TABS: 0.2500 mg | ORAL_TABLET | Freq: Every day | ORAL | 0 refills | Status: DC | PRN

## 2019-10-06 MED ORDER — CITALOPRAM HYDROBROMIDE 10 MG PO TABS: 10.0000 mg | ORAL_TABLET | Freq: Every day | ORAL | 3 refills | Status: DC

## 2019-10-06 NOTE — Patient Instructions (Addendum)
Check with your insurance to see if they will cover the shingrix shot. Update me as needed.  Okay to try taking citalopram at night.   Let me know if you want to go through with colon cancer screening.  Take care.  Glad to see you.

## 2019-10-06 NOTE — Progress Notes (Signed)
This visit occurred during the SARS-CoV-2 public health emergency.  Safety protocols were in place, including screening questions prior to the visit, additional usage of staff PPE, and extensive cleaning of exam room while observing appropriate contact time as indicated for disinfecting solutions.  I have personally reviewed the Medicare Annual Wellness questionnaire and have noted 1. The patient's medical and social history 2. Their use of alcohol, tobacco or illicit drugs 3. Their current medications and supplements 4. The patient's functional ability including ADL's, fall risks, home safety risks and hearing or visual             impairment. 5. Diet and physical activities 6. Evidence for depression or mood disorders  The patients weight, height, BMI have been recorded in the chart and visual acuity is per eye clinic.  I have made referrals, counseling and provided education to the patient based review of the above and I have provided the pt with a written personalized care plan for preventive services.  Provider list updated- see scanned forms.  Routine anticipatory guidance given to patient.  See health maintenance. The possibility exists that previously documented standard health maintenance information may have been brought forward from a previous encounter into this note.  If needed, that same information has been updated to reflect the current situation based on today's encounter.    Flu 2020 Shingles discussed with patient.   PNA up-to-date Tetanus 2015 Covid vaccine 2021, [place] Colon cancer screening encouraged.  Options discussed with patient.  Declined by patient. Breast cancer screening 2020 Bone density test 2019 per gynecology. Advance directive-son designated if patient were incapacitated. Cognitive function addressed- see scanned forms- and if abnormal then additional documentation follows.   Hypertension:    Using medication without problems or lightheadedness:  yes Chest pain with exertion:no Edema:no Short of breath:no Average home BPs: lower on home check.    Elevated Cholesterol: Using medications without problems: yes Muscle aches: no Diet compliance: variable adherence, d/w pt.  Exercise: yes  Mood d/w pt.  No ADE on med.  Compliant.  Mood is better with med. "I'm doing well."    PMH and SH reviewed  Meds, vitals, and allergies reviewed.   ROS: Per HPI.  Unless specifically indicated otherwise in HPI, the patient denies:  General: fever. Eyes: acute vision changes ENT: sore throat Cardiovascular: chest pain Respiratory: SOB GI: vomiting GU: dysuria Musculoskeletal: acute back pain Derm: acute rash Neuro: acute motor dysfunction Psych: worsening mood Endocrine: polydipsia Heme: bleeding Allergy: hayfever  GEN: nad, alert and oriented HEENT: NCAT NECK: supple w/o LA CV: rrr. PULM: ctab, no inc wob ABD: soft, +bs EXT: no edema SKIN: no acute rash

## 2019-10-08 NOTE — Assessment & Plan Note (Signed)
Flu 2020 Shingles discussed with patient.   PNA up-to-date Tetanus 2015 Covid vaccine 2021, Sheila Nguyen cancer screening encouraged.  Options discussed with patient.  Declined by patient. Breast cancer screening 2020 Bone density test 2019 per gynecology. Advance directive-son designated if patient were incapacitated. Cognitive function addressed- see scanned forms- and if abnormal then additional documentation follows.

## 2019-10-08 NOTE — Assessment & Plan Note (Signed)
Continue work on diet and exercise.  Continue pravastatin.  She agrees.  Labs discussed with patient.

## 2019-10-08 NOTE — Assessment & Plan Note (Signed)
Mood d/w pt.  No ADE on med.  Compliant.  Mood is better with med. "I'm doing well."   Continue Wellbutrin and citalopram.  Rare use of alprazolam but she was able to use that so she could fly.

## 2019-10-08 NOTE — Assessment & Plan Note (Signed)
Controlled on home checks.  Continue olmesartan 5 mg daily.  She agrees.

## 2019-12-02 DIAGNOSIS — Z1231 Encounter for screening mammogram for malignant neoplasm of breast: Secondary | ICD-10-CM | POA: Diagnosis not present

## 2019-12-06 DIAGNOSIS — C801 Malignant (primary) neoplasm, unspecified: Secondary | ICD-10-CM

## 2019-12-06 HISTORY — DX: Malignant (primary) neoplasm, unspecified: C80.1

## 2019-12-14 ENCOUNTER — Ambulatory Visit: Payer: Medicare Other | Admitting: Physician Assistant

## 2019-12-14 DIAGNOSIS — R928 Other abnormal and inconclusive findings on diagnostic imaging of breast: Secondary | ICD-10-CM | POA: Diagnosis not present

## 2019-12-14 DIAGNOSIS — N6012 Diffuse cystic mastopathy of left breast: Secondary | ICD-10-CM | POA: Diagnosis not present

## 2020-01-06 ENCOUNTER — Other Ambulatory Visit: Payer: Self-pay | Admitting: Radiology

## 2020-01-06 DIAGNOSIS — C50411 Malignant neoplasm of upper-outer quadrant of right female breast: Secondary | ICD-10-CM | POA: Diagnosis not present

## 2020-01-06 DIAGNOSIS — C50912 Malignant neoplasm of unspecified site of left female breast: Secondary | ICD-10-CM | POA: Diagnosis not present

## 2020-01-06 DIAGNOSIS — N6311 Unspecified lump in the right breast, upper outer quadrant: Secondary | ICD-10-CM | POA: Diagnosis not present

## 2020-01-06 DIAGNOSIS — R928 Other abnormal and inconclusive findings on diagnostic imaging of breast: Secondary | ICD-10-CM | POA: Diagnosis not present

## 2020-01-06 DIAGNOSIS — Z170 Estrogen receptor positive status [ER+]: Secondary | ICD-10-CM | POA: Diagnosis not present

## 2020-01-08 ENCOUNTER — Encounter: Payer: Self-pay | Admitting: *Deleted

## 2020-01-08 ENCOUNTER — Other Ambulatory Visit: Payer: Self-pay | Admitting: Family Medicine

## 2020-01-08 ENCOUNTER — Telehealth: Payer: Self-pay | Admitting: Hematology

## 2020-01-08 DIAGNOSIS — C50411 Malignant neoplasm of upper-outer quadrant of right female breast: Secondary | ICD-10-CM | POA: Insufficient documentation

## 2020-01-08 DIAGNOSIS — Z170 Estrogen receptor positive status [ER+]: Secondary | ICD-10-CM

## 2020-01-08 NOTE — Telephone Encounter (Signed)
Spoke to patient to confirm PM clinic, explained how the day will work and to expect a call from the surgeons office and to look for packet in mail from [place]

## 2020-01-08 NOTE — Telephone Encounter (Signed)
Electronic refill request. Alprazolam Last office visit:  10/06/2019 Last Filled:     10 tablet 0 10/06/2019

## 2020-01-11 NOTE — Telephone Encounter (Signed)
Sent. Thanks.   

## 2020-01-12 NOTE — Progress Notes (Signed)
[facility]   [phone] [phone] Note   Patient Care Team: Tonia Ghent, MD as PCP - General (Family Medicine) Mauro Kaufmann, RN as Oncology Nurse Navigator Rockwell Germany, RN as Oncology Nurse Navigator Coralie Keens, MD as Consulting Physician (General Surgery) Truitt Merle, MD as Consulting Physician (Hematology) Kyung Rudd, MD as Consulting Physician (Radiation Oncology)  Date of Service:  01/13/2020   CHIEF COMPLAINTS/PURPOSE OF CONSULTATION:  Newly Diagnosed B/l breast cancer    Oncology History Overview Note  Cancer Staging Malignant neoplasm of upper-outer quadrant of left breast in female, estrogen receptor positive ([place]) Staging form: Breast, AJCC 8th Edition - Clinical stage from 01/13/2020: Stage IA (cT1b, cN0, cM0, G1, ER+, PR-, HER2-) - Unsigned  Malignant neoplasm of upper-outer quadrant of right breast in female, estrogen receptor positive ([place]) Staging form: Breast, AJCC 8th Edition - Clinical stage from 01/13/2020: Stage IA (cT1a, cN0, cM0, G1, ER+, PR+, HER2-) - Unsigned    Malignant neoplasm of upper-outer quadrant of right breast in female, estrogen receptor positive ([place])  12/14/2019 Breast US   [place] 12/14/19  IMPRESSION Alther the cyst in the left breast mass correlate to the mammographoc finding, there are some irregular mergins on the mammogrma and warrant a left biopsy.  The 58mm irregular mass in the 9:30 position of right breast 6cm from the nipple is suspicious of malignancy.    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 And 2. Based on the biopsy, the carcinoma appears Nottingham grade 1 of 3 and measures 1cm in the greatest linear extent.      01/06/2020 Receptors her2   HER2 Negative  ER: 80% positive, Strong staining intensity  PR: 10% positive, Strong staining intensity  Ki67: 1%   01/08/2020 Initial Diagnosis   Malignant neoplasm of upper-outer quadrant of right breast in female, estrogen receptor positive ([place])   Malignant neoplasm of upper-outer quadrant of left breast in female, estrogen receptor positive ([place])  12/14/2019 Breast US   [place] 12/14/19  IMPRESSION Alther the cyst in the left breast mass correlate to the mammographoc finding, there are some irregular mergins on the mammogrma and warrant a left biopsy.  The 35mm irregular mass in the 9:30 position of right breast 6cm from the nipple is suspicious of malignancy.    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 And 2. Based on the biopsy, the carcinoma appears Nottingham grade 1 of 3 and measures 1cm in the greatest linear extent.    01/06/2020 Receptors her2   HER2 Negative  ER: 80% positive, moderately staining intensity  PR: Negative Ki67 1%   01/08/2020 Initial Diagnosis   Malignant neoplasm of upper-outer quadrant of left breast in female, estrogen receptor positive ([place])      HISTORY OF PRESENTING ILLNESS:  Sheila Nguyen 74 y.o. female is a here because of newly diagnosed b/l breast cancer. The patient presents to the clinic today alone.  Her breast masses were found by screening mammogram. She did  not feel any mass and denies change in her breast, nipples and body. She denies any other recent changes.   Socially she is widowed and has 1 adult child. She is a retired Oceanographer. She does not drink or smoke. She has a PMHx of HLD, HTN, Anxiety. She had C-section before. She notes after her husband died 2.5  years ago and was on Wellbutrin since he was sick and only uses Xanax for flying.     GYN HISTORY  Menarchal: 10 LMP: in her late 68s Contraceptive: Yes for 3 years HRT: yes G1- first at 89    REVIEW OF SYSTEMS:    Constitutional: Denies fevers, chills or abnormal night sweats Eyes: Denies blurriness of vision, double vision or watery eyes Ears, nose, mouth, throat, and face: Denies mucositis or sore throat Respiratory: Denies cough, dyspnea or wheezes Cardiovascular: Denies palpitation, chest discomfort or lower extremity swelling Gastrointestinal:  Denies nausea, heartburn or change in bowel habits Skin: Denies abnormal skin rashes Lymphatics: Denies new lymphadenopathy or easy bruising Neurological:Denies numbness, tingling or new weaknesses Behavioral/Psych: Mood is stable, no new changes  All other systems were reviewed with the patient and are negative.   MEDICAL HISTORY:  Past Medical History:  Diagnosis Date  . Anxiety   . Hyperlipidemia   . Hypertension   . Urinary incontinence    With coughing and sneezing    SURGICAL HISTORY: Past Surgical History:  Procedure Laterality Date  . 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 CESAREAN SECTION  1979    SOCIAL HISTORY: Social History   Socioeconomic History  . Marital status: Widowed    Spouse name: Not on file  . Number of children: 2  . Years of education: Not on file  . Highest education level: Not on file  Occupational History  . Occupation: Scientist, product/process development: [employer]    Comment: BA  Psychology  Tobacco Use  . Smoking status: Never Smoker  . Smokeless tobacco: Never Used  Substance and Sexual Activity  . Alcohol use: No    Alcohol/week: 0.0 standard drinks  . Drug use: No  . Sexual activity: Never  Other Topics Concern  . Not on file  Social History Narrative   Widowed 06/2017.  Was prev divorced from first husband then had remarried longtime boyfriend.     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     Social Determinants of Health   Financial Resource Strain: Low Risk   . Difficulty of Paying Living Expenses: Not hard at all  Food Insecurity: No Food Insecurity  . Worried About Charity fundraiser in the Last Year: Never true  . Ran Out of Food in the Last Year: Never true  Transportation Needs: No Transportation Needs  . Lack of Transportation (Medical): No  . Lack of Transportation (Non-Medical): No  Physical Activity:   . Days of Exercise per Week: Not on file  . Minutes of Exercise per Session: Not on file  Stress:   . Feeling of Stress : Not on file  Social Connections:   . Frequency of Communication with Friends and Family: Not on file  . Frequency of Social Gatherings with Friends and Family: Not on file  . Attends Religious Services: Not on file  . Active Member of Clubs or Organizations: Not  on file  . Attends Archivist Meetings: Not on file  . Marital Status: Not on file  Intimate Partner Violence:   . Fear of Current or Ex-Partner: Not on file  . Emotionally Abused: Not on file  . Physically Abused: Not on file  . Sexually Abused: Not on file    FAMILY HISTORY: Family History  Problem Relation Age of Onset  . Alcohol abuse Father   . Stroke Father   . Colon cancer Neg Hx   . Breast cancer Neg Hx     ALLERGIES:  is allergic to other, wellbutrin [bupropion], and amoxicillin.  MEDICATIONS:  Current Outpatient Medications  Medication Sig Dispense Refill  . aspirin EC 81 MG tablet Take 81 mg by mouth daily. Swallow whole.    Marland Kitchen ALPRAZolam (XANAX) 0.25 MG tablet TAKE 1 TABLET BY MOUTH DAILY AS NEEDED FOR ANXIETY( FOR FLIGHTS IF NEEDED). MAY CAUSE DROWSINESS. 10 tablet 1  . buPROPion (WELLBUTRIN SR) 100 MG 12 hr tablet Take 1 tablet (100 mg total) by mouth daily. 90 tablet 3  . citalopram (CELEXA) 10 MG  tablet Take 1 tablet (10 mg total) by mouth daily. 90 tablet 3  . FIBER FORMULA PO Take 1 capsule by mouth 2 (two) times daily.      Marland Kitchen olmesartan (BENICAR) 5 MG tablet Take 1 tablet (5 mg total) by mouth daily. 90 tablet 3  . pravastatin (PRAVACHOL) 40 MG tablet 1 tab by mouth at night. 90 tablet 3   No current facility-administered medications for this visit.    PHYSICAL EXAMINATION: ECOG PERFORMANCE STATUS: 0 - Asymptomatic  Vitals:   01/13/20 1301  BP: (!) 173/75  Pulse: 85  Resp: 18  Temp: 97.8 F (36.6 C)  SpO2: 98%   Filed Weights   01/13/20 1301  Weight: 165 lb (74.8 kg)    GENERAL:alert, no distress and comfortable SKIN: skin color, texture, turgor are normal, no rashes or significant lesions (+) Multiple skin lesions of torso EYES: normal, Conjunctiva are pink and non-injected, sclera clear  NECK: supple, thyroid normal size, non-tender, without nodularity LYMPH:  no palpable lymphadenopathy in the cervical, axillary  LUNGS: clear to auscultation and percussion with normal breathing effort HEART: regular rate & rhythm and no murmurs and no lower extremity edema ABDOMEN:abdomen soft, non-tender and normal bowel sounds Musculoskeletal:no cyanosis of digits and no clubbing  NEURO: alert & oriented x 3 with fluent speech, no focal motor/sensory deficits BREAST: (+) Mild B/l skin ecchymosis at biopsy sites. No palpable mass, nodules or adenopathy bilaterally. Breast exam benign.  LABORATORY DATA:  I have reviewed the data as listed CBC Latest Ref Rng & Units 01/13/2020  WBC 4.0 - 10.5 K/uL 7.2  Hemoglobin 12.0 - 15.0 g/dL 14.9  Hematocrit 36 - 46 % 45.9  Platelets 150 - 400 K/uL 248    CMP Latest Ref Rng & Units 01/13/2020 10/01/2019 04/17/2018  Glucose 70 - 99 mg/dL 147(H) 96 97  BUN 8 - 23 mg/dL $Remove'12 15 13  'TussCua$ Creatinine 0.44 - 1.00 mg/dL 0.90 0.81 0.80  Sodium 135 - 145 mmol/L 140 138 142  Potassium 3.5 - 5.1 mmol/L 4.0 4.0 4.2  Chloride 98 - 111 mmol/L 106 104 106    CO2 22 - 32 mmol/L $RemoveB'25 26 28  'YjOSWKHs$ Calcium 8.9 - 10.3 mg/dL 9.5 9.3 9.4  Total Protein 6.5 - 8.1 g/dL 7.4 7.1 7.2  Total Bilirubin 0.3 - 1.2 mg/dL 0.4 0.5 0.5  Alkaline Phos 38 - 126 U/L 119 112  103  AST 15 - 41 U/L $Remo'17 17 17  'xUrqM$ ALT 0 - 44 U/L $Remo'25 19 23     'kOXHL$ RADIOGRAPHIC STUDIES: I have personally reviewed the radiological images as listed and agreed with the findings in the report. No results found.  ASSESSMENT & PLAN:  Sheila Nguyen is a 74 y.o. Caucasian female with a history of HTN, HLD, Anxiety/Depression   1.  Malignant neoplasm of upper-outer quadrant of left breast, stage 1A, c(T1bN0M0), ER+/PR-/HER2-, Grade I   2.  Malignant neoplasm of upper-outer quadrant of right breast , Stage IA, c(T1aN0M0), ER+/PR+/HER2-, Grade I   -We discussed her image findings and the biopsy results in great details. 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 Overall very similar cancers.  -Given the early stage disease, she likely need bilateral lumpectomy. She is agreeable with that. She was seen by Dr. Ninfa Linden today and likely will proceed with surgery soon.  Due to her age and small breast region, sentinel lymph node biopsy is not recommended. -If her tumors are more than 1cm, I may recommend a Oncotype Dx test on the surgical sample and we'll make a decision about adjuvant chemotherapy based on the Oncotype result.  She is 18, but overall in good health, is still candidate for chemotherapy if needed.  However, I anticipated this is low risk disease. -The risk of recurrence depends on the stage and biology of the tumor. She is early stage, both cancer ER+, PR negative or low expression and Her2-. I discussed this is the more common type of slow growing tumor.  -She was also seen by radiation oncologist Dr. Lisbeth Renshaw today. Adjuvant radiation maybe recommended after lumpectomy, but based on her age and tiny tumors it is  understandable if she does not proceed with Radiation.  -Given the strong ER and PR expression in her postmenopausal status, I recommend adjuvant endocrine therapy with aromatase inhibitor for a total of 5-7 years to reduce the risk of cancer recurrence. Potential benefits and side effects were discussed with patient and she is interested.  She does have a history of osteopenia, will decided AI vs tamoxifen based on her next DEXA scan.  -We also discussed the breast cancer surveillance after her surgery. She will continue annual screening mammogram, self exam, and a routine office visit with lab and exam with [place]. -I encouraged her to have healthy diet and exercise regularly -Labs reviewed, CBC and CMP WNL except BG 147. Initial physical exam benign -F/u after surgery or Radiation.     3. Osteopenia  -Her 09/2017 DEXA showed osteopenia per pt.  -I discussed AI can weaken her bone, so I recommend repeating DEXA to determine which antiestrogen therapy to start with. She is agreeable.    4. Comorbidities: HTN, HLD, Anxiety/Depression -Continue Medication and F/u with PCP    PLAN:  -DEXA in 1-2 months  -Proceed with Surgery soon  -F/u after surgery or Radiation, to finalize adjuvant antiestrogen therapy.   Orders Placed This Encounter  Procedures  . DG Bone Density    Standing Status:   Future    Standing Expiration Date:   01/12/2021    Scheduling Instructions:     [facility]    Order Specific Question:   Reason for Exam (SYMPTOM  OR DIAGNOSIS REQUIRED)    Answer:   screening    Order Specific Question:   Preferred imaging location?    Answer:   External  All questions were answered. The patient knows to call the clinic with any problems, questions or concerns. The total time spent in the appointment was 45 minutes.     Truitt Merle, MD 01/13/2020 5:16 PM  I, Joslyn Devon, am acting as scribe for Truitt Merle, MD.   I have reviewed the above documentation for accuracy and completeness,  and I agree with the above.

## 2020-01-13 ENCOUNTER — Inpatient Hospital Stay: Payer: Medicare Other | Attending: Hematology | Admitting: Hematology

## 2020-01-13 ENCOUNTER — Ambulatory Visit
Admission: RE | Admit: 2020-01-13 | Discharge: 2020-01-13 | Disposition: A | Discharge status: Home / Self Care | Payer: Medicare Other | Source: Ambulatory Visit | Attending: Radiation Oncology | Admitting: Radiation Oncology

## 2020-01-13 ENCOUNTER — Encounter: Payer: Self-pay | Admitting: *Deleted

## 2020-01-13 ENCOUNTER — Other Ambulatory Visit: Payer: Self-pay

## 2020-01-13 ENCOUNTER — Other Ambulatory Visit: Payer: Self-pay | Admitting: Surgery

## 2020-01-13 ENCOUNTER — Inpatient Hospital Stay: Payer: Medicare Other

## 2020-01-13 ENCOUNTER — Encounter: Payer: Self-pay | Admitting: Hematology

## 2020-01-13 ENCOUNTER — Ambulatory Visit: Payer: Medicare Other | Admitting: Physical Therapy

## 2020-01-13 VITALS — BP 173/75 | HR 85 | Temp 97.8°F | Resp 18 | Ht 65.0 in | Wt 165.0 lb

## 2020-01-13 DIAGNOSIS — E785 Hyperlipidemia, unspecified: Secondary | ICD-10-CM | POA: Insufficient documentation

## 2020-01-13 DIAGNOSIS — I10 Essential (primary) hypertension: Secondary | ICD-10-CM | POA: Insufficient documentation

## 2020-01-13 DIAGNOSIS — Z880 Allergy status to penicillin: Secondary | ICD-10-CM | POA: Diagnosis not present

## 2020-01-13 DIAGNOSIS — N6002 Solitary cyst of left breast: Secondary | ICD-10-CM | POA: Insufficient documentation

## 2020-01-13 DIAGNOSIS — M8580 Other specified disorders of bone density and structure, unspecified site: Secondary | ICD-10-CM | POA: Insufficient documentation

## 2020-01-13 DIAGNOSIS — C50912 Malignant neoplasm of unspecified site of left female breast: Secondary | ICD-10-CM | POA: Diagnosis not present

## 2020-01-13 DIAGNOSIS — C50412 Malignant neoplasm of upper-outer quadrant of left female breast: Secondary | ICD-10-CM

## 2020-01-13 DIAGNOSIS — C50411 Malignant neoplasm of upper-outer quadrant of right female breast: Secondary | ICD-10-CM

## 2020-01-13 DIAGNOSIS — Z823 Family history of stroke: Secondary | ICD-10-CM | POA: Insufficient documentation

## 2020-01-13 DIAGNOSIS — F419 Anxiety disorder, unspecified: Secondary | ICD-10-CM | POA: Insufficient documentation

## 2020-01-13 DIAGNOSIS — Z811 Family history of alcohol abuse and dependence: Secondary | ICD-10-CM | POA: Insufficient documentation

## 2020-01-13 DIAGNOSIS — Z79899 Other long term (current) drug therapy: Secondary | ICD-10-CM | POA: Diagnosis not present

## 2020-01-13 DIAGNOSIS — C50911 Malignant neoplasm of unspecified site of right female breast: Secondary | ICD-10-CM | POA: Diagnosis not present

## 2020-01-13 DIAGNOSIS — Z170 Estrogen receptor positive status [ER+]: Secondary | ICD-10-CM

## 2020-01-13 DIAGNOSIS — F329 Major depressive disorder, single episode, unspecified: Secondary | ICD-10-CM | POA: Insufficient documentation

## 2020-01-13 DIAGNOSIS — E2839 Other primary ovarian failure: Secondary | ICD-10-CM | POA: Diagnosis not present

## 2020-01-13 DIAGNOSIS — Z853 Personal history of malignant neoplasm of breast: Secondary | ICD-10-CM

## 2020-01-13 LAB — CMP (CANCER CENTER ONLY)
ALT: 25 U/L (ref 0–44)
AST: 17 U/L (ref 15–41)
Albumin: 3.9 g/dL (ref 3.5–5.0)
Alkaline Phosphatase: 119 U/L (ref 38–126)
Anion gap: 9 (ref 5–15)
BUN: 12 mg/dL (ref 8–23)
CO2: 25 mmol/L (ref 22–32)
Calcium: 9.5 mg/dL (ref 8.9–10.3)
Chloride: 106 mmol/L (ref 98–111)
Creatinine: 0.9 mg/dL (ref 0.44–1.00)
GFR, Est AFR Am: >60 mL/min (ref >60)
GFR, Estimated: >60 mL/min (ref >60)
Glucose, Bld: 147 mg/dL — ABNORMAL HIGH (ref 70–99)
Potassium: 4 mmol/L (ref 3.5–5.1)
Sodium: 140 mmol/L (ref 135–145)
Total Bilirubin: 0.4 mg/dL (ref 0.3–1.2)
Total Protein: 7.4 g/dL (ref 6.5–8.1)

## 2020-01-13 LAB — CBC WITH DIFFERENTIAL (CANCER CENTER ONLY)
Abs Immature Granulocytes: 0.02 10*3/uL (ref 0.00–0.07)
Basophils Absolute: 0 10*3/uL (ref 0.0–0.1)
Basophils Relative: 0 %
Eosinophils Absolute: 0.1 10*3/uL (ref 0.0–0.5)
Eosinophils Relative: 1 %
HCT: 45.9 % (ref 36.0–46.0)
Hemoglobin: 14.9 g/dL (ref 12.0–15.0)
Immature Granulocytes: 0 %
Lymphocytes Relative: 26 %
Lymphs Abs: 1.9 10*3/uL (ref 0.7–4.0)
MCH: 29.7 pg (ref 26.0–34.0)
MCHC: 32.5 g/dL (ref 30.0–36.0)
MCV: 91.4 fL (ref 80.0–100.0)
Monocytes Absolute: 0.8 10*3/uL (ref 0.1–1.0)
Monocytes Relative: 11 %
Neutro Abs: 4.4 10*3/uL (ref 1.7–7.7)
Neutrophils Relative %: 62 %
Platelet Count: 248 10*3/uL (ref 150–400)
RBC: 5.02 MIL/uL (ref 3.87–5.11)
RDW: 13.3 % (ref 11.5–15.5)
WBC Count: 7.2 10*3/uL (ref 4.0–10.5)
nRBC: 0 % (ref 0.0–0.2)

## 2020-01-13 LAB — GENETIC SCREENING ORDER

## 2020-01-13 NOTE — Progress Notes (Signed)
Radiation Oncology         [phone] ________________________________  Name: Sheila Nguyen        MRN: [id number]  Date of Service: 01/13/2020 DOB: 1946/01/13  WE:[id number], Sheila Rising, MD  Coralie Keens, MD     REFERRING PHYSICIAN: Coralie Keens, MD   DIAGNOSIS: The encounter diagnosis was Malignant neoplasm of upper-outer quadrant of right breast in female, estrogen receptor positive ([place]).   HISTORY OF PRESENT ILLNESS: Sheila Nguyen is a 74 y.o. female seen in the multidisciplinary breast clinic for a new diagnosis of right breast cancer. The patient was noted to have screening detected mass in the right breast and an asymmetry in the left breast. Diagnostic imaging measured the lesion in the 9:30 position in the right breast as 3 mm. The left assymmetry was at 12:00 but did not have an ultrasound correlate. 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 Her right breast biopsy showed a grade 1, invasive ductal carcinoma with associated DCIS, that was ER/PR positive, HER2 negative with a Ki 67 of 1%. She's seen today to discuss treatment recommendations for her cancer.    PREVIOUS RADIATION THERAPY: No   PAST MEDICAL HISTORY:  Past Medical History:  Diagnosis Date  . Anxiety   . Hyperlipidemia   . Hypertension   . Urinary incontinence    With coughing and sneezing       PAST SURGICAL HISTORY: Past Surgical History:  Procedure Laterality Date  . 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 CESAREAN SECTION  1979     FAMILY HISTORY:  Family History  Problem Relation Age of Onset  . Alcohol abuse Father   . Stroke Father   . Colon cancer Neg Hx   . Breast cancer Neg Hx      SOCIAL HISTORY:  reports that she has never smoked. She has never used smokeless tobacco. She reports that she does not drink alcohol and does not use drugs. The patient is  widowed and lives in [place]. She is retired from working at the [employer].   ALLERGIES: Other, Wellbutrin [bupropion], and Amoxicillin   MEDICATIONS:  Current Outpatient Medications  Medication Sig Dispense Refill  . ALPRAZolam (XANAX) 0.25 MG tablet TAKE 1 TABLET BY MOUTH DAILY AS NEEDED FOR ANXIETY( FOR FLIGHTS IF NEEDED). MAY CAUSE DROWSINESS. 10 tablet 1  . aspirin 81 MG tablet Take 81 mg by mouth daily.      Marland Kitchen buPROPion (WELLBUTRIN SR) 100 MG 12 hr tablet Take 1 tablet (100 mg total) by mouth daily. 90 tablet 3  . cholecalciferol (VITAMIN D) 1000 UNITS tablet Take 2,000 Units by mouth daily.      . citalopram (CELEXA) 10 MG tablet Take 1 tablet (10 mg total) by mouth daily. 90 tablet 3  . FIBER FORMULA PO Take 1 capsule by mouth 2 (two) times daily.      . Multiple Vitamins-Minerals (PRESERVISION AREDS 2) CAPS     . olmesartan (BENICAR) 5 MG tablet Take 1 tablet (5 mg total) by mouth daily. 90 tablet 3  . pravastatin (PRAVACHOL) 40 MG tablet 1 tab by mouth at night. 90 tablet 3   No current facility-administered medications for this encounter.     REVIEW OF SYSTEMS: On review of systems, the patient reports that she is doing well overall. She denies  any specific concerns regarding her breasts.    PHYSICAL EXAM:  Wt Readings from Last 3 Encounters:  10/06/19 166 lb 8 oz (75.5 kg)  12/15/18 151 lb (68.5 kg)  04/21/18 152 lb (68.9 kg)   Temp Readings from Last 3 Encounters:  10/06/19 (!) 97 F (36.1 C) (Temporal)  12/15/18 97.8 F (36.6 C)  04/21/18 97.9 F (36.6 C) (Oral)   BP Readings from Last 3 Encounters:  10/06/19 (!) 158/82  12/15/18 99/64  04/21/18 (!) 158/80   Pulse Readings from Last 3 Encounters:  10/06/19 82  12/15/18 74  04/21/18 84    In general this is a well appearing caucasian female in no acute distress. She's alert and oriented x4 and appropriate throughout the examination. Cardiopulmonary assessment is negative for  acute distress and she exhibits normal effort. Bilateral breast exam is deferred.    ECOG = 0  0 - Asymptomatic (Fully active, able to carry on all predisease activities without restriction)  1 - Symptomatic but completely ambulatory (Restricted in physically strenuous activity but ambulatory and able to carry out work of a light or sedentary nature. For example, light housework, office work)  2 - Symptomatic, <50% in bed during the day (Ambulatory and capable of all self care but unable to carry out any work activities. Up and about more than 50% of waking hours)  3 - Symptomatic, >50% in bed, but not bedbound (Capable of only limited self-care, confined to bed or chair 50% or more of waking hours)  4 - Bedbound (Completely disabled. Cannot carry on any self-care. Totally confined to bed or chair)  5 - Death   Eustace Pen MM, Creech RH, Tormey DC, et al. [phone]). "Toxicity and response criteria of the [facility]". [place] Oncol. 5 (6): 649-55    LABORATORY DATA:  No results found for: WBC, HGB, HCT, MCV, PLT Lab Results  Component Value Date   NA 138 10/01/2019   K 4.0 10/01/2019   CL 104 10/01/2019   CO2 26 10/01/2019   Lab Results  Component Value Date   ALT 19 10/01/2019   AST 17 10/01/2019   ALKPHOS 112 10/01/2019   BILITOT 0.5 10/01/2019      RADIOGRAPHY: No results found.     IMPRESSION/PLAN: 1. 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 Dr. Lisbeth Renshaw discusses the pathology findings and reviews the nature of bilateral breast disease. The consensus from the breast conference includes breast conservation with lumpectomy of bilateral breasts. Dr. Burr Medico reviews the rationale for oncotype Dx score to determine a role for systemic therapy given her good performance status. We discussed the rationale  for external radiotherapy to the breast followed by antiestrogen therapy. Dr. Lisbeth Renshaw also discusses scenarios for which radiotherapy is considered an optional therapy in this patient's age group with favorable features. We discussed the risks, benefits, short, and long term effects of radiotherapy, and the patient is interested in proceeding as her goals are to remain aggressive about her therapy. Dr. Lisbeth Renshaw discusses the delivery and logistics of radiotherapy and anticipates a course of 4 weeks of radiotherapy. We will see her back a few weeks after surgery to discuss the simulation process and anticipate we starting radiotherapy about 4-6 weeks after surgery.  2. Possible genetic predisposition to malignancy. The patient is a candidate for genetic testing given her personal  and history. She was offered referral and is not interested in discussion regarding this today. She will let us know if she would like to proceed in the future with this discussion.    In a visit lasting 60 minutes, greater than 50% of the time was spent face to face reviewing her case, as well as in preparation of, discussing, and coordinating the patient's care.  The above documentation reflects my direct findings during this shared patient visit. Please see the separate note by Dr. Lisbeth Renshaw on this date for the remainder of the patient's plan of care.    Carola Rhine, PAC

## 2020-01-14 ENCOUNTER — Encounter: Payer: Self-pay | Admitting: General Practice

## 2020-01-14 NOTE — Progress Notes (Signed)
Breast Multidisciplinary Clinic Spiritual Care  Met with Vaughan Basta by phone following Breast Multidisciplinary Clinic to introduce [place] team/resources and assess for distress and other psychosocial needs.   Laurine has community around her but prefers privacy regarding her health. One significant coping tool in the coming fortnight is anticipating and packing for an [place] trip which she is taking with her son who lives in [place].   Follow up needed: No. Ms Spindler prefers to stay present as much as possible instead of talking about her diagnosis, but she is aware of ongoing [facility] team/programming availability, should needs arise or circumstances change.   [place], [place], [facility] Pager [phone] Voicemail [phone]

## 2020-01-15 ENCOUNTER — Telehealth: Payer: Self-pay | Admitting: Hematology

## 2020-01-15 NOTE — Telephone Encounter (Signed)
No 9/8 los

## 2020-01-21 ENCOUNTER — Encounter: Payer: Self-pay | Admitting: *Deleted

## 2020-01-21 ENCOUNTER — Telehealth: Payer: Self-pay | Admitting: *Deleted

## 2020-01-21 ENCOUNTER — Other Ambulatory Visit: Payer: Self-pay | Admitting: Surgery

## 2020-01-21 NOTE — Telephone Encounter (Signed)
Spoke with patient to follow up from [facility] and assess navigation needs.  She states she has not heard anything regarding her sx date from the surgeon's office.  Informed her I had sent a message for an update.  She would like to know the date before she leaves for her cruise at the end of next week.  Informed her I would let Dr. Trevor Mace office know as well.  No other needs or concerns at this time. Encouraged her to call should anything arise. Patient verbalized understanding.

## 2020-01-26 ENCOUNTER — Encounter: Payer: Self-pay | Admitting: *Deleted

## 2020-01-26 DIAGNOSIS — C50411 Malignant neoplasm of upper-outer quadrant of right female breast: Secondary | ICD-10-CM

## 2020-01-26 NOTE — Progress Notes (Signed)
Nutrition  Patient identified by attending Breast Clinic on 01/13/2020. Patient was given nutrition packet with RD contact information by nurse navigator.   Chart reviewed.   74 year old female with new diagnosis of bilateral breast cancers.  Planning bilateral lumpectomies, possible oncotype, possible radiation and antiestrogens.    Ht: 65 inches Wt: 165 lb BMI: 27  No weight loss.  Patient is currently not at nutritional risk.  Please consult RD if changes occur in nutritional status.   Sheila Nguyen B. Zenia Resides, [place], [place] Registered Dietitian [phone] (mobile)

## 2020-01-27 DIAGNOSIS — Z20822 Contact with and (suspected) exposure to covid-19: Secondary | ICD-10-CM | POA: Diagnosis not present

## 2020-01-27 DIAGNOSIS — M8589 Other specified disorders of bone density and structure, multiple sites: Secondary | ICD-10-CM | POA: Diagnosis not present

## 2020-02-01 ENCOUNTER — Encounter: Payer: Self-pay | Admitting: *Deleted

## 2020-02-08 ENCOUNTER — Other Ambulatory Visit (HOSPITAL_COMMUNITY): Payer: Medicare Other

## 2020-02-08 ENCOUNTER — Other Ambulatory Visit: Payer: Self-pay

## 2020-02-08 ENCOUNTER — Encounter (HOSPITAL_BASED_OUTPATIENT_CLINIC_OR_DEPARTMENT_OTHER): Payer: Self-pay | Admitting: Surgery

## 2020-02-09 ENCOUNTER — Encounter (HOSPITAL_BASED_OUTPATIENT_CLINIC_OR_DEPARTMENT_OTHER)
Admission: RE | Admit: 2020-02-09 | Discharge: 2020-02-09 | Disposition: A | Discharge status: Home / Self Care | Payer: Medicare Other | Source: Ambulatory Visit | Attending: Surgery | Admitting: Surgery

## 2020-02-09 ENCOUNTER — Other Ambulatory Visit (HOSPITAL_COMMUNITY)
Admission: RE | Admit: 2020-02-09 | Discharge: 2020-02-09 | Disposition: A | Discharge status: Home / Self Care | Payer: Medicare Other | Source: Ambulatory Visit | Attending: Surgery | Admitting: Surgery

## 2020-02-09 DIAGNOSIS — Z01818 Encounter for other preprocedural examination: Secondary | ICD-10-CM | POA: Diagnosis not present

## 2020-02-09 DIAGNOSIS — Z01810 Encounter for preprocedural cardiovascular examination: Secondary | ICD-10-CM | POA: Insufficient documentation

## 2020-02-09 DIAGNOSIS — Z01812 Encounter for preprocedural laboratory examination: Secondary | ICD-10-CM | POA: Diagnosis present

## 2020-02-09 DIAGNOSIS — Z20822 Contact with and (suspected) exposure to covid-19: Secondary | ICD-10-CM | POA: Insufficient documentation

## 2020-02-09 LAB — SARS CORONAVIRUS 2 (TAT 6-24 HRS): SARS Coronavirus 2: NEGATIVE

## 2020-02-09 MED ORDER — ENSURE PRE-SURGERY PO LIQD: 296.0000 mL | Freq: Once | ORAL | Status: DC

## 2020-02-09 NOTE — Progress Notes (Signed)
Surgical soap given with instructions, pt verbalized understanding.  Patient declined Ensure pre surgery drink and G2 drink due to both containing citric acid.

## 2020-02-10 DIAGNOSIS — C50812 Malignant neoplasm of overlapping sites of left female breast: Secondary | ICD-10-CM | POA: Diagnosis not present

## 2020-02-10 DIAGNOSIS — C50411 Malignant neoplasm of upper-outer quadrant of right female breast: Secondary | ICD-10-CM | POA: Diagnosis not present

## 2020-02-10 NOTE — H&P (Signed)
Sheila Nguyen Appointment: 01/13/2020 1:00 PM Location: [facility] Patient #: [id number] DOB: 07-Feb-1946 Undefined / Language: Sheila Nguyen / Race: White Female   History of Present Illness Sheila Canary A. Ninfa Linden MD; 01/13/2020 1:28 PM) The patient is a 74 year old female who presents with breast cancer.  : Chief complaint : bilateral breast cancers  This is a 74 year old female who was found to have abnormalities on both breast on recent screening mammography. She underwent stereotactic biopsy of each of these. The right breast was a 3 mm lesion at the 9:30 position which was ER positive, PR weakly positive, with a Ki-67 of 1%. It was HER-2 negative. At the 12 o'clock position of the left breast with a 7 mm nodule which was ER positive PR negative, and Ki-67 1% as well it was also HER-2 negative. Both showed sides showed invasive and in situ ductal carcinoma. She has had no previous problems from her breast. She denies nipple discharge. There is no family history of breast cancer. She is otherwise healthy without complaints. She has no cardiopulmonary history or problems.   Past Surgical History Sheila Slipper, RN; 01/13/2020 8:20 AM) Cesarean Section - 1  Oral Surgery   Diagnostic Studies History Sheila Slipper, RN; 01/13/2020 8:20 AM) Colonoscopy  never Mammogram  within last year Pap Smear  1-5 years ago  Medication History Sheila Slipper, RN; 01/13/2020 8:20 AM) Medications Reconciled  Social History Sheila Slipper, RN; 01/13/2020 8:20 AM) Caffeine use  Coffee. No alcohol use  No drug use  Tobacco use  Never smoker.  Family History Sheila Slipper, RN; 01/13/2020 8:20 AM) Alcohol Abuse  Father. Hypertension  Father.  Pregnancy / Birth History Sheila Slipper, RN; 01/13/2020 8:20 AM) Age at menarche  31 years. Age of menopause  1-50 Contraceptive History  Oral contraceptives. Gravida  1 Maternal age  35-35 Para  1 Regular periods   Other Problems Sheila Slipper, RN; 01/13/2020 8:20 AM) High blood pressure  Hypercholesterolemia     Review of Systems Sheila Slipper RN; 01/13/2020 8:20 AM) General Not Present- Appetite Loss, Chills, Fatigue, Fever, Night Sweats, Weight Gain and Weight Loss. Skin Not Present- Change in Wart/Mole, Dryness, Hives, Jaundice, New Lesions, Non-Healing Wounds, Rash and Ulcer. HEENT Present- Seasonal Allergies and Wears glasses/contact lenses. Not Present- Earache, Hearing Loss, Hoarseness, Nose Bleed, Oral Ulcers, Ringing in the Ears, Sinus Pain, Sore Throat, Visual Disturbances and Yellow Eyes. Respiratory Not Present- Bloody sputum, Chronic Cough, Difficulty Breathing, Snoring and Wheezing. Breast Not Present- Breast Mass, Breast Pain, Nipple Discharge and Skin Changes. Cardiovascular Not Present- Chest Pain, Difficulty Breathing Lying Down, Leg Cramps, Palpitations, Rapid Heart Rate, Shortness of Breath and Swelling of Extremities. Gastrointestinal Not Present- Abdominal Pain, Bloating, Bloody Stool, Change in Bowel Habits, Chronic diarrhea, Constipation, Difficulty Swallowing, Excessive gas, Gets full quickly at meals, Hemorrhoids, Indigestion, Nausea, Rectal Pain and Vomiting. Female Genitourinary Not Present- Frequency, Nocturia, Painful Urination, Pelvic Pain and Urgency. Musculoskeletal Not Present- Back Pain, Joint Pain, Joint Stiffness, Muscle Pain, Muscle Weakness and Swelling of Extremities. Neurological Not Present- Decreased Memory, Fainting, Headaches, Numbness, Seizures, Tingling, Tremor, Trouble walking and Weakness. Psychiatric Not Present- Anxiety, Bipolar, Change in Sleep Pattern, Depression, Fearful and Frequent crying. Endocrine Not Present- Cold Intolerance, Excessive Hunger, Hair Changes, Heat Intolerance, Hot flashes and New Diabetes. Hematology Present- Blood Thinners. Not Present- Easy Bruising, Excessive bleeding, Gland problems, HIV and Persistent Infections.   Physical Exam (Sheila Walberg A. Ninfa Linden  MD; 01/13/2020 1:29 PM) The physical exam findings are as follows:  Note: She appears well on exam  Her breasts are normal in appearance bilaterally. The nipple areolar complexes are normal. There are no palpable masses in either breast. There is no axillary adenopathy on either side    Assessment & Plan   BILATERAL BREAST CANCER (C50.911)  Impression: I have reviewed her notes in the electronic medical records. I have reviewed her mammograms and ultrasound. I have also reviewed her pathology results. She was also discussed in our multidisciplinary breast cancer conference. Again, she has 2 small bilateral invasive and in situ ductal carcinoma. It is recommended we proceed with bilateral radioactive seed guided lumpectomies. We discussed breast conservation versus mastectomy. We discussed whether or not she may need postoperative radiation depend on the size of the tumors. Given her age and the size of the malignancies, we have decided to hold on any lymph node biopsies We next proceeded to discuss bilateral radioactive seed guided lumpectomies. We discussed the risk which includes but is not limited to bleeding, infection, the need for further surgeries if margins are positive, cardiopulmonary issues, injury to surrounding structures, postoperative recovery, etc. She understands and wished to proceed with breast conservation which again would be with radioactive seed guided bilateral breast lumpectomies

## 2020-02-11 ENCOUNTER — Ambulatory Visit (HOSPITAL_BASED_OUTPATIENT_CLINIC_OR_DEPARTMENT_OTHER): Payer: Medicare Other | Admitting: Anesthesiology

## 2020-02-11 ENCOUNTER — Other Ambulatory Visit: Payer: Self-pay

## 2020-02-11 ENCOUNTER — Ambulatory Visit (HOSPITAL_BASED_OUTPATIENT_CLINIC_OR_DEPARTMENT_OTHER)
Admission: RE | Admit: 2020-02-11 | Discharge: 2020-02-11 | Disposition: A | Discharge status: Home / Self Care | Payer: Medicare Other | Attending: Surgery | Admitting: Surgery

## 2020-02-11 ENCOUNTER — Encounter (HOSPITAL_BASED_OUTPATIENT_CLINIC_OR_DEPARTMENT_OTHER): Payer: Self-pay | Admitting: Surgery

## 2020-02-11 ENCOUNTER — Encounter (HOSPITAL_BASED_OUTPATIENT_CLINIC_OR_DEPARTMENT_OTHER)
Admission: RE | Disposition: A | Discharge status: Home / Self Care | Payer: Self-pay | Source: Home / Self Care | Attending: Surgery

## 2020-02-11 DIAGNOSIS — I10 Essential (primary) hypertension: Secondary | ICD-10-CM | POA: Diagnosis not present

## 2020-02-11 DIAGNOSIS — Z170 Estrogen receptor positive status [ER+]: Secondary | ICD-10-CM | POA: Diagnosis not present

## 2020-02-11 DIAGNOSIS — D0512 Intraductal carcinoma in situ of left breast: Secondary | ICD-10-CM | POA: Diagnosis not present

## 2020-02-11 DIAGNOSIS — N6082 Other benign mammary dysplasias of left breast: Secondary | ICD-10-CM | POA: Diagnosis not present

## 2020-02-11 DIAGNOSIS — N6089 Other benign mammary dysplasias of unspecified breast: Secondary | ICD-10-CM | POA: Insufficient documentation

## 2020-02-11 DIAGNOSIS — D0592 Unspecified type of carcinoma in situ of left breast: Secondary | ICD-10-CM | POA: Diagnosis not present

## 2020-02-11 DIAGNOSIS — N6091 Unspecified benign mammary dysplasia of right breast: Secondary | ICD-10-CM | POA: Diagnosis not present

## 2020-02-11 DIAGNOSIS — C50412 Malignant neoplasm of upper-outer quadrant of left female breast: Secondary | ICD-10-CM | POA: Diagnosis not present

## 2020-02-11 DIAGNOSIS — C50411 Malignant neoplasm of upper-outer quadrant of right female breast: Secondary | ICD-10-CM | POA: Diagnosis not present

## 2020-02-11 DIAGNOSIS — D0511 Intraductal carcinoma in situ of right breast: Secondary | ICD-10-CM | POA: Diagnosis not present

## 2020-02-11 DIAGNOSIS — N6012 Diffuse cystic mastopathy of left breast: Secondary | ICD-10-CM | POA: Insufficient documentation

## 2020-02-11 DIAGNOSIS — D0591 Unspecified type of carcinoma in situ of right breast: Secondary | ICD-10-CM | POA: Diagnosis not present

## 2020-02-11 DIAGNOSIS — Z8249 Family history of ischemic heart disease and other diseases of the circulatory system: Secondary | ICD-10-CM | POA: Diagnosis not present

## 2020-02-11 DIAGNOSIS — N6011 Diffuse cystic mastopathy of right breast: Secondary | ICD-10-CM | POA: Insufficient documentation

## 2020-02-11 DIAGNOSIS — N6021 Fibroadenosis of right breast: Secondary | ICD-10-CM | POA: Diagnosis not present

## 2020-02-11 DIAGNOSIS — C50912 Malignant neoplasm of unspecified site of left female breast: Secondary | ICD-10-CM | POA: Diagnosis not present

## 2020-02-11 DIAGNOSIS — C50911 Malignant neoplasm of unspecified site of right female breast: Secondary | ICD-10-CM | POA: Diagnosis not present

## 2020-02-11 HISTORY — PX: BREAST LUMPECTOMY WITH RADIOACTIVE SEED LOCALIZATION: SHX6424

## 2020-02-11 SURGERY — BREAST LUMPECTOMY WITH RADIOACTIVE SEED LOCALIZATION
Anesthesia: General | Site: Breast | Laterality: Bilateral

## 2020-02-11 MED ORDER — CIPROFLOXACIN IN D5W 400 MG/200ML IV SOLN
400.0000 mg | INTRAVENOUS | Status: AC
  Administered 2020-02-11: 400 mg via INTRAVENOUS

## 2020-02-11 MED ORDER — ONDANSETRON HCL 4 MG/2ML IJ SOLN
INTRAMUSCULAR | Status: AC
  Filled 2020-02-11: qty 2

## 2020-02-11 MED ORDER — DEXAMETHASONE SODIUM PHOSPHATE 10 MG/ML IJ SOLN
INTRAMUSCULAR | Status: DC | PRN
  Administered 2020-02-11: 5 mg via INTRAVENOUS

## 2020-02-11 MED ORDER — CHLORHEXIDINE GLUCONATE CLOTH 2 % EX PADS: 6.0000 | MEDICATED_PAD | Freq: Once | CUTANEOUS | Status: DC

## 2020-02-11 MED ORDER — ONDANSETRON HCL 4 MG/2ML IJ SOLN
4.0000 mg | Freq: Once | INTRAMUSCULAR | Status: AC | PRN
  Administered 2020-02-11: 4 mg via INTRAVENOUS

## 2020-02-11 MED ORDER — PROMETHAZINE HCL 25 MG/ML IJ SOLN
INTRAMUSCULAR | Status: AC
  Filled 2020-02-11: qty 1

## 2020-02-11 MED ORDER — LIDOCAINE HCL (CARDIAC) PF 100 MG/5ML IV SOSY
PREFILLED_SYRINGE | INTRAVENOUS | Status: DC | PRN
  Administered 2020-02-11: 60 mg via INTRAVENOUS

## 2020-02-11 MED ORDER — LIDOCAINE 2% (20 MG/ML) 5 ML SYRINGE
INTRAMUSCULAR | Status: AC
  Filled 2020-02-11: qty 5

## 2020-02-11 MED ORDER — GLYCOPYRROLATE 0.2 MG/ML IJ SOLN
INTRAMUSCULAR | Status: DC | PRN
  Administered 2020-02-11: .1 mg via INTRAVENOUS

## 2020-02-11 MED ORDER — PHENYLEPHRINE HCL (PRESSORS) 10 MG/ML IV SOLN
INTRAVENOUS | Status: DC | PRN
  Administered 2020-02-11 (×2): 80 ug via INTRAVENOUS

## 2020-02-11 MED ORDER — ONDANSETRON HCL 4 MG/2ML IJ SOLN
INTRAMUSCULAR | Status: DC | PRN
  Administered 2020-02-11: 4 mg via INTRAVENOUS

## 2020-02-11 MED ORDER — FENTANYL CITRATE (PF) 100 MCG/2ML IJ SOLN
INTRAMUSCULAR | Status: DC | PRN
  Administered 2020-02-11: 50 ug via INTRAVENOUS
  Administered 2020-02-11 (×2): 25 ug via INTRAVENOUS

## 2020-02-11 MED ORDER — 0.9 % SODIUM CHLORIDE (POUR BTL) OPTIME
TOPICAL | Status: DC | PRN
  Administered 2020-02-11: 1000 mL

## 2020-02-11 MED ORDER — SODIUM CHLORIDE (PF) 0.9 % IJ SOLN
INTRAMUSCULAR | Status: AC
  Filled 2020-02-11: qty 10

## 2020-02-11 MED ORDER — ACETAMINOPHEN 500 MG PO TABS
ORAL_TABLET | ORAL | Status: AC
  Filled 2020-02-11: qty 2

## 2020-02-11 MED ORDER — FENTANYL CITRATE (PF) 100 MCG/2ML IJ SOLN: 25.0000 ug | INTRAMUSCULAR | Status: DC | PRN

## 2020-02-11 MED ORDER — PROMETHAZINE HCL 25 MG/ML IJ SOLN
5.0000 mg | Freq: Once | INTRAMUSCULAR | Status: AC | PRN
  Administered 2020-02-11: 5 mg via INTRAVENOUS

## 2020-02-11 MED ORDER — BUPIVACAINE-EPINEPHRINE 0.5% -1:200000 IJ SOLN
INTRAMUSCULAR | Status: DC | PRN
  Administered 2020-02-11: 20 mL

## 2020-02-11 MED ORDER — CIPROFLOXACIN IN D5W 400 MG/200ML IV SOLN
INTRAVENOUS | Status: AC
  Filled 2020-02-11: qty 200

## 2020-02-11 MED ORDER — GABAPENTIN 300 MG PO CAPS
ORAL_CAPSULE | ORAL | Status: AC
  Filled 2020-02-11: qty 1

## 2020-02-11 MED ORDER — DEXAMETHASONE SODIUM PHOSPHATE 10 MG/ML IJ SOLN
INTRAMUSCULAR | Status: AC
  Filled 2020-02-11: qty 1

## 2020-02-11 MED ORDER — AMISULPRIDE (ANTIEMETIC) 5 MG/2ML IV SOLN
INTRAVENOUS | Status: AC
  Filled 2020-02-11: qty 2

## 2020-02-11 MED ORDER — FENTANYL CITRATE (PF) 100 MCG/2ML IJ SOLN
INTRAMUSCULAR | Status: AC
  Filled 2020-02-11: qty 2

## 2020-02-11 MED ORDER — ACETAMINOPHEN 500 MG PO TABS
1000.0000 mg | ORAL_TABLET | ORAL | Status: AC
  Administered 2020-02-11: 1000 mg via ORAL

## 2020-02-11 MED ORDER — GLYCOPYRROLATE 0.2 MG/ML IJ SOLN
INTRAMUSCULAR | Status: AC
  Filled 2020-02-11: qty 1

## 2020-02-11 MED ORDER — AMISULPRIDE (ANTIEMETIC) 5 MG/2ML IV SOLN
10.0000 mg | Freq: Once | INTRAVENOUS | Status: AC
  Administered 2020-02-11: 10 mg via INTRAVENOUS

## 2020-02-11 MED ORDER — TRAMADOL HCL 50 MG PO TABS: 50.0000 mg | ORAL_TABLET | Freq: Four times a day (QID) | ORAL | 1 refills | Status: DC | PRN

## 2020-02-11 MED ORDER — PROPOFOL 10 MG/ML IV BOLUS
INTRAVENOUS | Status: DC | PRN
  Administered 2020-02-11: 150 mg via INTRAVENOUS

## 2020-02-11 MED ORDER — PROPOFOL 10 MG/ML IV BOLUS
INTRAVENOUS | Status: AC
  Filled 2020-02-11: qty 20

## 2020-02-11 MED ORDER — LACTATED RINGERS IV SOLN: INTRAVENOUS | Status: DC

## 2020-02-11 MED ORDER — GABAPENTIN 300 MG PO CAPS
300.0000 mg | ORAL_CAPSULE | ORAL | Status: AC
  Administered 2020-02-11: 300 mg via ORAL

## 2020-02-11 SURGICAL SUPPLY — 51 items
ADH SKN CLS APL DERMABOND .7 (GAUZE/BANDAGES/DRESSINGS) ×1
APL PRP STRL LF DISP 70% ISPRP (MISCELLANEOUS) ×1
APPLIER CLIP 9.375 MED OPEN (MISCELLANEOUS) ×3
APR CLP MED 9.3 20 MLT OPN (MISCELLANEOUS) ×1
BINDER BREAST 3XL (GAUZE/BANDAGES/DRESSINGS) IMPLANT
BINDER BREAST LRG (GAUZE/BANDAGES/DRESSINGS) IMPLANT
BINDER BREAST MEDIUM (GAUZE/BANDAGES/DRESSINGS) IMPLANT
BINDER BREAST XLRG (GAUZE/BANDAGES/DRESSINGS) ×2 IMPLANT
BINDER BREAST XXLRG (GAUZE/BANDAGES/DRESSINGS) IMPLANT
BLADE SURG 15 STRL LF DISP TIS (BLADE) ×1 IMPLANT
BLADE SURG 15 STRL SS (BLADE) ×3
CANISTER SUC SOCK COL 7IN (MISCELLANEOUS) IMPLANT
CANISTER SUCT 1200ML W/VALVE (MISCELLANEOUS) IMPLANT
CHLORAPREP W/TINT 26 (MISCELLANEOUS) ×3 IMPLANT
CLIP APPLIE 9.375 MED OPEN (MISCELLANEOUS) IMPLANT
COVER BACK TABLE 60X90IN (DRAPES) ×3 IMPLANT
COVER MAYO STAND STRL (DRAPES) ×3 IMPLANT
COVER PROBE W GEL 5X96 (DRAPES) ×3 IMPLANT
COVER WAND RF STERILE (DRAPES) IMPLANT
DECANTER SPIKE VIAL GLASS SM (MISCELLANEOUS) IMPLANT
DERMABOND ADVANCED (GAUZE/BANDAGES/DRESSINGS) ×2
DERMABOND ADVANCED .7 DNX12 (GAUZE/BANDAGES/DRESSINGS) ×1 IMPLANT
DRAPE LAPAROSCOPIC ABDOMINAL (DRAPES) ×3 IMPLANT
DRAPE UTILITY XL STRL (DRAPES) ×3 IMPLANT
DRSG PAD ABDOMINAL 8X10 ST (GAUZE/BANDAGES/DRESSINGS) ×4 IMPLANT
ELECT REM PT RETURN 9FT ADLT (ELECTROSURGICAL) ×3
ELECTRODE REM PT RTRN 9FT ADLT (ELECTROSURGICAL) ×1 IMPLANT
GAUZE SPONGE 4X4 12PLY STRL LF (GAUZE/BANDAGES/DRESSINGS) IMPLANT
GLOVE SURG SIGNA 7.5 PF LTX (GLOVE) ×3 IMPLANT
GOWN STRL REUS W/ TWL LRG LVL3 (GOWN DISPOSABLE) ×1 IMPLANT
GOWN STRL REUS W/ TWL XL LVL3 (GOWN DISPOSABLE) ×1 IMPLANT
GOWN STRL REUS W/TWL LRG LVL3 (GOWN DISPOSABLE) ×3
GOWN STRL REUS W/TWL XL LVL3 (GOWN DISPOSABLE) ×3
KIT MARKER MARGIN INK (KITS) ×3 IMPLANT
NDL HYPO 25X1 1.5 SAFETY (NEEDLE) ×1 IMPLANT
NEEDLE HYPO 25X1 1.5 SAFETY (NEEDLE) ×3 IMPLANT
NS IRRIG 1000ML POUR BTL (IV SOLUTION) IMPLANT
PACK BASIN DAY SURGERY FS (CUSTOM PROCEDURE TRAY) ×3 IMPLANT
PENCIL SMOKE EVACUATOR (MISCELLANEOUS) ×3 IMPLANT
SLEEVE SCD COMPRESS KNEE MED (MISCELLANEOUS) ×3 IMPLANT
SPONGE LAP 4X18 RFD (DISPOSABLE) ×3 IMPLANT
SUT MNCRL AB 4-0 PS2 18 (SUTURE) ×5 IMPLANT
SUT SILK 2 0 SH (SUTURE) IMPLANT
SUT VIC AB 3-0 SH 27 (SUTURE) ×6
SUT VIC AB 3-0 SH 27X BRD (SUTURE) ×2 IMPLANT
SYR CONTROL 10ML LL (SYRINGE) ×3 IMPLANT
TOWEL GREEN STERILE FF (TOWEL DISPOSABLE) ×3 IMPLANT
TRAY FAXITRON CT DISP (TRAY / TRAY PROCEDURE) ×6 IMPLANT
TUBE CONNECTING 20'X1/4 (TUBING)
TUBE CONNECTING 20X1/4 (TUBING) IMPLANT
YANKAUER SUCT BULB TIP NO VENT (SUCTIONS) IMPLANT

## 2020-02-11 NOTE — Anesthesia Procedure Notes (Signed)
Procedure Name: LMA Insertion Date/Time: 02/11/2020 10:46 AM Performed by: Collier Bullock, CRNA Pre-anesthesia Checklist: Patient identified, Emergency Drugs available, Suction available and Patient being monitored Patient Re-evaluated:Patient Re-evaluated prior to induction Oxygen Delivery Method: Circle system utilized Preoxygenation: Pre-oxygenation with 100% oxygen Induction Type: IV induction Ventilation: Mask ventilation without difficulty LMA: LMA inserted LMA Size: 4.0 Number of attempts: 1 Placement Confirmation: positive ETCO2 and breath sounds checked- equal and bilateral Tube secured with: Tape Dental Injury: Teeth and Oropharynx as per pre-operative assessment

## 2020-02-11 NOTE — Anesthesia Postprocedure Evaluation (Signed)
Anesthesia Post Note  Patient: Sheila Nguyen  Procedure(s) Performed: BILATERAL BREAST LUMPECTOMY WITH RADIOACTIVE SEED LOCALIZATIONS (Bilateral Breast)     Patient location during evaluation: PACU Anesthesia Type: General Level of consciousness: awake and alert Pain management: pain level controlled Vital Signs Assessment: post-procedure vital signs reviewed and stable Respiratory status: spontaneous breathing, nonlabored ventilation and respiratory function stable Cardiovascular status: blood pressure returned to baseline and stable Postop Assessment: no apparent nausea or vomiting Anesthetic complications: no   No complications documented.  Last Vitals:  Vitals:   02/11/20 1245 02/11/20 1300  BP: (!) 131/104 139/78  Pulse: 70 66  Resp: 14 15  Temp:    SpO2: 98% 95%    Last Pain:  Vitals:   02/11/20 1230  TempSrc:   PainSc: 0-No pain                 Lynda Rainwater

## 2020-02-11 NOTE — Anesthesia Preprocedure Evaluation (Addendum)
Anesthesia Evaluation  Patient identified by MRN, date of birth, ID band Patient awake    Reviewed: Allergy & Precautions, NPO status , Patient's Chart, lab work & pertinent test results  Airway Mallampati: III  TM Distance: >3 FB Neck ROM: Full    Dental no notable dental hx.    Pulmonary neg pulmonary ROS,    Pulmonary exam normal breath sounds clear to auscultation       Cardiovascular hypertension, Pt. on medications Normal cardiovascular exam Rhythm:Regular Rate:Normal  ECG: NSR, rate 88   Neuro/Psych PSYCHIATRIC DISORDERS Anxiety Depression negative neurological ROS     GI/Hepatic negative GI ROS, Neg liver ROS,   Endo/Other  negative endocrine ROS  Renal/GU negative Renal ROS     Musculoskeletal negative musculoskeletal ROS (+)   Abdominal   Peds  Hematology HLD   Anesthesia Other Findings BILATERAL BREAST CANCER  Reproductive/Obstetrics                            Anesthesia Physical Anesthesia Plan  ASA: II  Anesthesia Plan: General   Post-op Pain Management:    Induction: Intravenous  PONV Risk Score and Plan: 3 and Ondansetron, Dexamethasone and Treatment may vary due to age or medical condition  Airway Management Planned: LMA  Additional Equipment:   Intra-op Plan:   Post-operative Plan: Extubation in OR  Informed Consent: I have reviewed the patients History and Physical, chart, labs and discussed the procedure including the risks, benefits and alternatives for the proposed anesthesia with the patient or authorized representative who has indicated his/her understanding and acceptance.     Dental advisory given  Plan Discussed with: CRNA and Anesthesiologist  Anesthesia Plan Comments:        Anesthesia Quick Evaluation

## 2020-02-11 NOTE — Op Note (Signed)
BILATERAL BREAST LUMPECTOMY WITH RADIOACTIVE SEED LOCALIZATIONS  Procedure Note  Sheila Nguyen 02/11/2020   Pre-op Diagnosis: BILATERAL BREAST CANCER     Post-op Diagnosis: same  Procedure(s): BILATERAL BREAST LUMPECTOMY WITH RADIOACTIVE SEED LOCALIZATIONS  Surgeon(s): Coralie Keens, MD  Anesthesia: General  Staff:  Circulator: Paulette Blanch, RN Scrub Person: Gaetano Net, RN  Estimated Blood Loss: Minimal               Specimens: sent to path  Indications: This is a 74 year old female who was found to have an abnormality in each breast on screening mammography.  She underwent stereotactic biopsy of each of these.  Both showed small invasive ductal carcinomas and DCIS which were both ER/PR positive.  The right side measured 3 mm and the left side measured 7 mm.  The decision was made to proceed with radioactive seed guided bilateral lumpectomies  Procedure: The patient was brought to the operating room and identifies the correct patient.  She was placed upon the operating room table and general anesthesia was induced.  Her breast were prepped and draped in usual sterile fashion.  With the neoprobe, I located the left breast radioactive seed at the 12 o'clock position.  I anesthetized the upper edge of the areola with Marcaine.  I made incision with a scalpel and then dissected down to the breast tissue with electrocautery.  With the aid of the neoprobe I then dissected superiorly to the area of the seed.  I then performed a lumpectomy going down near the chest wall.  I completed the lumpectomy.  I marked the margins with pain and x-rayed the specimen.  As it was being x-rayed I could visualize the surgical clip from the original biopsy.  I grasped this area with an Allis and excised it as the new posterior/inferior margin.  X-ray confirmed that the radioactive seed was in the original lumpectomy specimen and that the tissue marker was in the second specimen.  This was sent to  pathology for evaluation.  I placed clips around the margins of the cavity for marker purposes.  Hemostasis was achieved with cautery.  I then closed the subcutaneous tissue with interrupted 3-0 Vicryl sutures and closed the skin with a running 4-0 Monocryl. I next identified the radioactive seed at the 9:30 position of the right breast with the neoprobe.  This was fairly lateral.  I elected to anesthetize skin over top of the seed and make an incision there as it was quite superficial.  I then dissected down to the breast tissue with the cautery.  I then performed a lumpectomy staying around the seed with the neoprobe and the cautery.  Again the specimen was completely removed.  I marked the margins with pain.  An x-ray was performed showing that the radioactive seed and previous marker were in the specimen.  Both sides were then sent to pathology for evaluation.  I again placed surgical clips at the biopsy cavity.  I achieved hemostasis with cautery.  I then closed the subcutaneous tissue with interrupted 3-0 Vicryl sutures and closed the skin with a running 4-0 Monocryl.  Dermabond applied to both incisions.  The patient was then placed in a breast binder.  She was then extubated in the operating room and taken in a stable condition to the recovery room.          Coralie Keens   Date: 02/11/2020  Time: 11:37 AM

## 2020-02-11 NOTE — Transfer of Care (Signed)
Immediate Anesthesia Transfer of Care Note  Patient: Sheila Nguyen  Procedure(s) Performed: BILATERAL BREAST LUMPECTOMY WITH RADIOACTIVE SEED LOCALIZATIONS (Bilateral Breast)  Patient Location: PACU  Anesthesia Type:General  Level of Consciousness: awake, drowsy and patient cooperative  Airway & Oxygen Therapy: Patient Spontanous Breathing, Patient connected to face mask and aerosol face mask  Post-op Assessment: Report given to RN, Post -op Vital signs reviewed and stable, Patient moving all extremities X 4 and Patient able to stick tongue midline  Post vital signs: Reviewed and stable  Last Vitals:  Vitals Value Taken Time  BP 141/74 02/11/20 1143  Temp    Pulse 87 02/11/20 1144  Resp 19 02/11/20 1144  SpO2 100 % 02/11/20 1144  Vitals shown include unvalidated device data.  Last Pain:  Vitals:   02/11/20 0958  TempSrc: Oral  PainSc: 0-No pain      Patients Stated Pain Goal: 6 (91/63/84 6659)  Complications: No complications documented.

## 2020-02-11 NOTE — Discharge Instructions (Signed)
[facility] Phone Number [phone]  BREAST BIOPSY/ PARTIAL MASTECTOMY: POST OP INSTRUCTIONS  Always review your discharge instruction sheet given to you by the facility where your surgery was performed.  IF YOU HAVE DISABILITY OR FAMILY LEAVE FORMS, YOU MUST BRING THEM TO THE OFFICE FOR PROCESSING.  DO NOT GIVE THEM TO YOUR DOCTOR.  1. A prescription for pain medication may be given to you upon discharge.  Take your pain medication as prescribed, if needed.  If narcotic pain medicine is not needed, then you may take acetaminophen (Tylenol) or ibuprofen (Advil) as needed. Do not take Tylenol until 4 pm if needed. 2. Take your usually prescribed medications unless otherwise directed 3. If you need a refill on your pain medication, please contact your pharmacy.  They will contact our office to request authorization.  Prescriptions will not be filled after 5pm or on week-ends. 4. You should eat very light the first 24 hours after surgery, such as soup, crackers, pudding, etc.  Resume your normal diet the day after surgery. 5. Most patients will experience some swelling and bruising in the breast.  Ice packs and a good support bra will help.  Swelling and bruising can take several days to resolve.  6. It is common to experience some constipation if taking pain medication after surgery.  Increasing fluid intake and taking a stool softener will usually help or prevent this problem from occurring.  A mild laxative (Milk of Magnesia or Miralax) should be taken according to package directions if there are no bowel movements after 48 hours. 7. Unless discharge instructions indicate otherwise, you may remove your bandages 24-48 hours after surgery, and you may shower at that time.  You may have steri-strips (small skin tapes) in place directly over the incision.  These strips should be left on the skin for 7-10 days.  If your surgeon used skin glue on the incision, you may shower in 24  hours.  The glue will flake off over the next 2-3 weeks.  Any sutures or staples will be removed at the office during your follow-up visit. 8. ACTIVITIES:  You may resume regular daily activities (gradually increasing) beginning the next day.  Wearing a good support bra or sports bra minimizes pain and swelling.  You may have sexual intercourse when it is comfortable. a. You may drive when you no longer are taking prescription pain medication, you can comfortably wear a seatbelt, and you can safely maneuver your car and apply brakes. b. RETURN TO WORK:  ______________________________________________________________________________________ 9. You should see your doctor in the office for a follow-up appointment approximately two weeks after your surgery.  Your doctor's nurse will typically make your follow-up appointment when she calls you with your pathology report.  Expect your pathology report 2-3 business days after your surgery.  You may call to check if you do not hear from [place] after three days. 10. OTHER INSTRUCTIONS:YOU MAY REMOVE THE BINDER AND START SHOWERING TOMORROW 11.  _ICE PACK, TYLENOL, AND IBUPROFEN ALSO FOR PAIN 12. NO VIGOROUS ACTIVITY FOR ONE WEEK 13. ______________________________________________________________________________________________ _____________________________________________________________________________________________________________________________________ _____________________________________________________________________________________________________________________________________ _____________________________________________________________________________________________________________________________________  WHEN TO CALL YOUR DOCTOR: 1. Fever over 101.0 2. Nausea and/or vomiting. 3. Extreme swelling or bruising. 4. Continued bleeding from incision. 5. Increased pain, redness, or drainage from the incision.  The clinic staff is available to answer  your questions during regular business hours.  Please don't hesitate to call and ask to speak to one of the nurses for clinical concerns.  If you  have a medical emergency, go to the nearest emergency room or call 911.  A surgeon from [facility] is always on call at the hospital.  For further questions, please visit [email or web address]    Post Anesthesia Home Care Instructions  Activity: Get plenty of rest for the remainder of the day. A responsible individual must stay with you for 24 hours following the procedure.  For the next 24 hours, DO NOT: -Drive a car -Paediatric nurse -Drink alcoholic beverages -Take any medication unless instructed by your physician -Make any legal decisions or sign important papers.  Meals: Start with liquid foods such as gelatin or soup. Progress to regular foods as tolerated. Avoid greasy, spicy, heavy foods. If nausea and/or vomiting occur, drink only clear liquids until the nausea and/or vomiting subsides. Call your physician if vomiting continues.  Special Instructions/Symptoms: Your throat may feel dry or sore from the anesthesia or the breathing tube placed in your throat during surgery. If this causes discomfort, gargle with warm salt water. The discomfort should disappear within 24 hours.

## 2020-02-11 NOTE — Interval H&P Note (Signed)
History and Physical Interval Note: no change in H and P  02/11/2020 10:35 AM  Sheila Nguyen  has presented today for surgery, with the diagnosis of BILATERAL BREAST CANCER.  The various methods of treatment have been discussed with the patient and family. After consideration of risks, benefits and other options for treatment, the patient has consented to  Procedure(s): BILATERAL BREAST LUMPECTOMY WITH RADIOACTIVE SEED LOCALIZATION (Bilateral) as a surgical intervention.  The patient's history has been reviewed, patient examined, no change in status, stable for surgery.  I have reviewed the patient's chart and labs.  Questions were answered to the patient's satisfaction.     Coralie Keens

## 2020-02-12 ENCOUNTER — Encounter (HOSPITAL_BASED_OUTPATIENT_CLINIC_OR_DEPARTMENT_OTHER): Payer: Self-pay | Admitting: Surgery

## 2020-02-20 LAB — SURGICAL PATHOLOGY

## 2020-03-12 ENCOUNTER — Other Ambulatory Visit: Payer: Self-pay | Admitting: Family Medicine

## 2020-03-14 NOTE — Telephone Encounter (Signed)
Pharmacy requests refill on: Olmesartan Medoxomil 5 mg  LAST REFILL: 01/15/2020 LAST OV: 10/06/2019 NEXT OV: Not Scheduled  PHARMACY: [facility] [phone] [facility]

## 2020-03-15 ENCOUNTER — Ambulatory Visit
Admission: RE | Admit: 2020-03-15 | Discharge: 2020-03-15 | Disposition: A | Discharge status: Home / Self Care | Payer: Medicare Other | Source: Ambulatory Visit | Attending: Radiation Oncology | Admitting: Radiation Oncology

## 2020-03-15 ENCOUNTER — Encounter: Payer: Self-pay | Admitting: Radiation Oncology

## 2020-03-15 ENCOUNTER — Other Ambulatory Visit: Payer: Self-pay

## 2020-03-15 VITALS — BP 145/70 | HR 87 | Temp 98.2°F | Resp 18 | Ht 62.5 in | Wt 166.6 lb

## 2020-03-15 DIAGNOSIS — C50412 Malignant neoplasm of upper-outer quadrant of left female breast: Secondary | ICD-10-CM | POA: Diagnosis not present

## 2020-03-15 DIAGNOSIS — Z7982 Long term (current) use of aspirin: Secondary | ICD-10-CM | POA: Insufficient documentation

## 2020-03-15 DIAGNOSIS — C50411 Malignant neoplasm of upper-outer quadrant of right female breast: Secondary | ICD-10-CM | POA: Diagnosis not present

## 2020-03-15 DIAGNOSIS — I10 Essential (primary) hypertension: Secondary | ICD-10-CM | POA: Insufficient documentation

## 2020-03-15 DIAGNOSIS — Z79899 Other long term (current) drug therapy: Secondary | ICD-10-CM | POA: Insufficient documentation

## 2020-03-15 DIAGNOSIS — Z170 Estrogen receptor positive status [ER+]: Secondary | ICD-10-CM | POA: Insufficient documentation

## 2020-03-15 DIAGNOSIS — E785 Hyperlipidemia, unspecified: Secondary | ICD-10-CM | POA: Insufficient documentation

## 2020-03-15 DIAGNOSIS — Z9889 Other specified postprocedural states: Secondary | ICD-10-CM | POA: Diagnosis not present

## 2020-03-15 DIAGNOSIS — F418 Other specified anxiety disorders: Secondary | ICD-10-CM | POA: Diagnosis not present

## 2020-03-15 NOTE — Progress Notes (Addendum)
Radiation Oncology         [phone] ________________________________  Name: Sheila Nguyen        MRN: [id number]  Date of Service: 03/15/2020 DOB: 1945/05/31  DE:YCXKGY, Elveria Rising, MD  Truitt Merle, MD     REFERRING PHYSICIAN: Truitt Merle, MD   DIAGNOSIS: The primary encounter diagnosis was Malignant neoplasm of upper-outer quadrant of left breast in female, estrogen receptor positive ([place]). A diagnosis of Malignant neoplasm of upper-outer quadrant of right breast in female, estrogen receptor positive ([place]) was also pertinent to this visit.   HISTORY OF PRESENT ILLNESS: Sheila Nguyen is a 74 y.o. female originally seen in the multidisciplinary breast clinic for a new diagnosis of right breast cancer. The patient was noted to have screening detected mass in the right breast and an asymmetry in the left breast. Diagnostic imaging measured the lesion in the 9:30 position in the right breast as 3 mm. The left assymmetry was at 12:00 but did not have an ultrasound correlate. 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 Her right breast biopsy showed a grade 1, invasive ductal carcinoma with associated DCIS, that was ER/PR positive, HER2 negative with a Ki 67 of 1%. She did elect to proceed with surgery and underwent bilateral lumpectomies on 02/11/2020.  Her left breast lumpectomy revealed an intermediate grade DCIS with columnar cell and fibrocystic changes without residual invasive component, additional posterior margin revealed focal atypical ductal hyperplasia with also fibrocystic changes and lobular neoplasia without residual carcinoma.  Her right breast lumpectomy revealed intermediate grade DCIS, adenosis with columnar cell and fibrocystic change and also no residual invasive carcinoma.  She is seen today to discuss treatment recommendations but there was some concerns of the  lack of invasive disease in both of her specimens. She has plans for repeat mammogram on 03/30/20 and to see Dr. Ninfa Linden to discuss these results on 04/05/20  She is seen today to discuss adjuvant therapy.    PREVIOUS RADIATION THERAPY: No   PAST MEDICAL HISTORY:  Past Medical History:  Diagnosis Date  . Anxiety   . Cancer ([place]) 12/2019   Bilateral IDC with DCIS  . Depression   . Hyperlipidemia   . Hypertension   . Urinary incontinence    With coughing and sneezing       PAST SURGICAL HISTORY: Past Surgical History:  Procedure Laterality Date  . 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 BREAST LUMPECTOMY WITH RADIOACTIVE SEED LOCALIZATION Bilateral 02/11/2020   Procedure: BILATERAL BREAST LUMPECTOMY WITH RADIOACTIVE SEED LOCALIZATIONS;  Surgeon: Coralie Keens, MD;  Location: [facility];  Service: General;  Laterality: Bilateral;  . CESAREAN SECTION  1979     FAMILY HISTORY:  Family History  Problem Relation Age of Onset  . Alcohol abuse Father   . Stroke Father   . Colon cancer Neg Hx   . Breast cancer Neg Hx      SOCIAL HISTORY:  reports that she has never smoked. She has never used smokeless tobacco. She reports that she does not drink alcohol and does not use drugs. The patient is widowed and lives in [place]. She is retired from working at the [employer].   ALLERGIES: Other, Amoxicillin, and Wellbutrin [bupropion]   MEDICATIONS:  Current Outpatient Medications  Medication Sig Dispense Refill  . aspirin EC  81 MG tablet Take 81 mg by mouth daily. Swallow whole.    Marland Kitchen buPROPion (WELLBUTRIN SR) 100 MG 12 hr tablet Take 1 tablet (100 mg total) by mouth daily. 90 tablet 3  . calcium carbonate (OS-CAL) 1250 (500 Ca) MG chewable tablet Chew 1 tablet by mouth 2 (two) times daily. Patients states that she is taking 600 mg twice daily    . citalopram (CELEXA) 10 MG tablet Take 1 tablet (10 mg total) by mouth daily. 90 tablet 3  .  FIBER FORMULA PO Take 1 capsule by mouth 2 (two) times daily.      Marland Kitchen olmesartan (BENICAR) 5 MG tablet TAKE 1 TABLET(5 MG) BY MOUTH DAILY. 90 tablet 1  . pravastatin (PRAVACHOL) 40 MG tablet 1 tab by mouth at night. 90 tablet 3  . ALPRAZolam (XANAX) 0.25 MG tablet TAKE 1 TABLET BY MOUTH DAILY AS NEEDED FOR ANXIETY( FOR FLIGHTS IF NEEDED). MAY CAUSE DROWSINESS. (Patient not taking: Reported on 03/15/2020) 10 tablet 1  . traMADol (ULTRAM) 50 MG tablet Take 1 tablet (50 mg total) by mouth every 6 (six) hours as needed for moderate pain or severe pain. (Patient not taking: Reported on 03/15/2020) 20 tablet 1   No current facility-administered medications for this encounter.     REVIEW OF SYSTEMS: On review of systems, the patient reports that she is doing well overall. She had some soreness in her right breast following surgery that has improved and no symptoms of pain or issues with the left breast. No other complaints are verbalized.    PHYSICAL EXAM:  Wt Readings from Last 3 Encounters:  03/15/20 166 lb 9.6 oz (75.6 kg)  02/11/20 166 lb 10.7 oz (75.6 kg)  01/13/20 165 lb (74.8 kg)   Temp Readings from Last 3 Encounters:  03/15/20 98.2 F (36.8 C)  02/11/20 98 F (36.7 C)  01/13/20 97.8 F (36.6 C) (Tympanic)   BP Readings from Last 3 Encounters:  03/15/20 (!) 145/70  02/11/20 (!) 156/64  01/13/20 (!) 173/75   Pulse Readings from Last 3 Encounters:  03/15/20 87  02/11/20 63  01/13/20 85    In general this is a well appearing caucasian female in no acute distress. She's alert and oriented x4 and appropriate throughout the examination. Cardiopulmonary assessment is negative for acute distress and she exhibits normal effort. Bilateral breast exam reveals well healed incision sites of both breasts without erythema or cellulitic streaking.    ECOG = 0  0 - Asymptomatic (Fully active, able to carry on all predisease activities without restriction)  1 - Symptomatic but completely  ambulatory (Restricted in physically strenuous activity but ambulatory and able to carry out work of a light or sedentary nature. For example, light housework, office work)  2 - Symptomatic, <50% in bed during the day (Ambulatory and capable of all self care but unable to carry out any work activities. Up and about more than 50% of waking hours)  3 - Symptomatic, >50% in bed, but not bedbound (Capable of only limited self-care, confined to bed or chair 50% or more of waking hours)  4 - Bedbound (Completely disabled. Cannot carry on any self-care. Totally confined to bed or chair)  5 - Death   Eustace Pen MM, Creech RH, Tormey DC, et al. [phone]). "Toxicity and response criteria of the [facility]". [place] Oncol. 5 (6): 649-55    LABORATORY DATA:  Lab Results  Component Value Date   WBC 7.2 01/13/2020   HGB  14.9 01/13/2020   HCT 45.9 01/13/2020   MCV 91.4 01/13/2020   PLT 248 01/13/2020   Lab Results  Component Value Date   NA 140 01/13/2020   K 4.0 01/13/2020   CL 106 01/13/2020   CO2 25 01/13/2020   Lab Results  Component Value Date   ALT 25 01/13/2020   AST 17 01/13/2020   ALKPHOS 119 01/13/2020   BILITOT 0.4 01/13/2020      RADIOGRAPHY: No results found.     IMPRESSION/PLAN: 1. 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 Dr. Lisbeth Renshaw discusses the pathology findings and reviews the nature of bilateral breast disease. The patient's case was discussed again in breast oncology conference. We discussed consensus from the breast conference includes mammogram to clarify the discrepancies in her pathology from biopsy to final report. Provided that she does not need additional surgery, she would benefit from adjuvant radiotherapy given this discrepancy in path is felt to be a high risk situation for  possible recurrence. We discussed the risks, benefits, short, and long term effects of radiotherapy, and the patient is interested in proceeding as her goals are to remain aggressive about her therapy. Dr. Lisbeth Renshaw discusses the delivery and logistics of radiotherapy and recommends 4 weeks of radiotherapy. We will see her back a few weeks from now for simulation once mammography has been performed. Written consent is obtained and placed in the chart, a copy was provided to the patient.   In a visit lasting 60 minutes, greater than 50% of the time was spent face to face reviewing her case, as well as in preparation of, discussing, and coordinating the patient's care.  The above documentation reflects my direct findings during this shared patient visit. Please see the separate note by Dr. Lisbeth Renshaw on this date for the remainder of the patient's plan of care.    Carola Rhine, PAC

## 2020-03-16 ENCOUNTER — Ambulatory Visit: Payer: Medicare Other | Admitting: Radiation Oncology

## 2020-03-21 ENCOUNTER — Encounter: Payer: Self-pay | Admitting: *Deleted

## 2020-03-30 ENCOUNTER — Ambulatory Visit: Payer: Medicare Other | Admitting: Radiation Oncology

## 2020-03-30 DIAGNOSIS — R928 Other abnormal and inconclusive findings on diagnostic imaging of breast: Secondary | ICD-10-CM | POA: Diagnosis not present

## 2020-03-30 DIAGNOSIS — Z853 Personal history of malignant neoplasm of breast: Secondary | ICD-10-CM | POA: Diagnosis not present

## 2020-04-05 ENCOUNTER — Other Ambulatory Visit: Payer: Self-pay

## 2020-04-05 ENCOUNTER — Ambulatory Visit
Admission: RE | Admit: 2020-04-05 | Discharge: 2020-04-05 | Disposition: A | Discharge status: Home / Self Care | Payer: Medicare Other | Source: Ambulatory Visit | Attending: Radiation Oncology | Admitting: Radiation Oncology

## 2020-04-05 DIAGNOSIS — C50411 Malignant neoplasm of upper-outer quadrant of right female breast: Secondary | ICD-10-CM | POA: Diagnosis not present

## 2020-04-05 DIAGNOSIS — C50412 Malignant neoplasm of upper-outer quadrant of left female breast: Secondary | ICD-10-CM | POA: Insufficient documentation

## 2020-04-05 DIAGNOSIS — Z170 Estrogen receptor positive status [ER+]: Secondary | ICD-10-CM | POA: Insufficient documentation

## 2020-04-05 DIAGNOSIS — Z510 Encounter for antineoplastic radiation therapy: Secondary | ICD-10-CM | POA: Diagnosis not present

## 2020-04-11 ENCOUNTER — Encounter: Payer: Self-pay | Admitting: *Deleted

## 2020-04-12 ENCOUNTER — Ambulatory Visit: Admission: RE | Admit: 2020-04-12 | Payer: Medicare Other | Source: Ambulatory Visit | Admitting: Radiation Oncology

## 2020-04-12 ENCOUNTER — Telehealth: Payer: Self-pay | Admitting: Oncology

## 2020-04-12 DIAGNOSIS — C50411 Malignant neoplasm of upper-outer quadrant of right female breast: Secondary | ICD-10-CM | POA: Diagnosis not present

## 2020-04-12 DIAGNOSIS — Z170 Estrogen receptor positive status [ER+]: Secondary | ICD-10-CM | POA: Diagnosis not present

## 2020-04-12 DIAGNOSIS — C50412 Malignant neoplasm of upper-outer quadrant of left female breast: Secondary | ICD-10-CM | POA: Insufficient documentation

## 2020-04-12 DIAGNOSIS — Z510 Encounter for antineoplastic radiation therapy: Secondary | ICD-10-CM | POA: Diagnosis not present

## 2020-04-12 NOTE — Telephone Encounter (Signed)
Scheduled appt per 12/6 sch msg - pt is aware of appt date and time

## 2020-04-13 ENCOUNTER — Ambulatory Visit
Admission: RE | Admit: 2020-04-13 | Discharge: 2020-04-13 | Disposition: A | Discharge status: Home / Self Care | Payer: Medicare Other | Source: Ambulatory Visit | Attending: Radiation Oncology | Admitting: Radiation Oncology

## 2020-04-13 DIAGNOSIS — C50411 Malignant neoplasm of upper-outer quadrant of right female breast: Secondary | ICD-10-CM | POA: Diagnosis not present

## 2020-04-13 DIAGNOSIS — Z510 Encounter for antineoplastic radiation therapy: Secondary | ICD-10-CM | POA: Diagnosis not present

## 2020-04-13 DIAGNOSIS — Z170 Estrogen receptor positive status [ER+]: Secondary | ICD-10-CM | POA: Diagnosis not present

## 2020-04-13 DIAGNOSIS — C50412 Malignant neoplasm of upper-outer quadrant of left female breast: Secondary | ICD-10-CM | POA: Diagnosis not present

## 2020-04-14 ENCOUNTER — Ambulatory Visit
Admission: RE | Admit: 2020-04-14 | Discharge: 2020-04-14 | Disposition: A | Discharge status: Home / Self Care | Payer: Medicare Other | Source: Ambulatory Visit | Attending: Radiation Oncology | Admitting: Radiation Oncology

## 2020-04-14 DIAGNOSIS — C50411 Malignant neoplasm of upper-outer quadrant of right female breast: Secondary | ICD-10-CM | POA: Diagnosis not present

## 2020-04-14 DIAGNOSIS — Z170 Estrogen receptor positive status [ER+]: Secondary | ICD-10-CM | POA: Diagnosis not present

## 2020-04-14 DIAGNOSIS — C50412 Malignant neoplasm of upper-outer quadrant of left female breast: Secondary | ICD-10-CM | POA: Diagnosis not present

## 2020-04-14 DIAGNOSIS — Z510 Encounter for antineoplastic radiation therapy: Secondary | ICD-10-CM | POA: Diagnosis not present

## 2020-04-14 NOTE — Progress Notes (Signed)
Pt here for patient teaching.  Pt given Radiation and You booklet, skin care instructions, Alra deodorant and Radiaplex gel.  Reviewed areas of pertinence such as fatigue, hair loss, skin changes, breast tenderness and breast swelling . Pt able to give teach back of to pat skin and use unscented/gentle soap,apply Radiaplex bid, avoid applying anything to skin within 4 hours of treatment, avoid wearing an under wire bra and to use an electric razor if they must shave. Pt verbalizes understanding of information given and will contact nursing with any questions or concerns.     Ayriana Wix M. Natalie Mceuen RN, BSN      

## 2020-04-15 ENCOUNTER — Ambulatory Visit
Admission: RE | Admit: 2020-04-15 | Discharge: 2020-04-15 | Disposition: A | Discharge status: Home / Self Care | Payer: Medicare Other | Source: Ambulatory Visit | Attending: Radiation Oncology | Admitting: Radiation Oncology

## 2020-04-15 DIAGNOSIS — Z170 Estrogen receptor positive status [ER+]: Secondary | ICD-10-CM

## 2020-04-15 DIAGNOSIS — C50412 Malignant neoplasm of upper-outer quadrant of left female breast: Secondary | ICD-10-CM

## 2020-04-15 DIAGNOSIS — C50411 Malignant neoplasm of upper-outer quadrant of right female breast: Secondary | ICD-10-CM | POA: Diagnosis not present

## 2020-04-15 DIAGNOSIS — Z510 Encounter for antineoplastic radiation therapy: Secondary | ICD-10-CM | POA: Diagnosis not present

## 2020-04-15 MED ORDER — ALRA NON-METALLIC DEODORANT (RAD-ONC)
1.0000 "application " | Freq: Once | TOPICAL | Status: AC
  Administered 2020-04-15: 1 via TOPICAL

## 2020-04-15 MED ORDER — RADIAPLEXRX EX GEL: Freq: Once | CUTANEOUS | Status: AC

## 2020-04-18 ENCOUNTER — Other Ambulatory Visit: Payer: Self-pay

## 2020-04-18 ENCOUNTER — Ambulatory Visit
Admission: RE | Admit: 2020-04-18 | Discharge: 2020-04-18 | Disposition: A | Discharge status: Home / Self Care | Payer: Medicare Other | Source: Ambulatory Visit | Attending: Radiation Oncology | Admitting: Radiation Oncology

## 2020-04-18 DIAGNOSIS — Z170 Estrogen receptor positive status [ER+]: Secondary | ICD-10-CM | POA: Diagnosis not present

## 2020-04-18 DIAGNOSIS — C50411 Malignant neoplasm of upper-outer quadrant of right female breast: Secondary | ICD-10-CM | POA: Diagnosis not present

## 2020-04-18 DIAGNOSIS — C50412 Malignant neoplasm of upper-outer quadrant of left female breast: Secondary | ICD-10-CM | POA: Diagnosis not present

## 2020-04-18 DIAGNOSIS — Z510 Encounter for antineoplastic radiation therapy: Secondary | ICD-10-CM | POA: Diagnosis not present

## 2020-04-19 ENCOUNTER — Ambulatory Visit
Admission: RE | Admit: 2020-04-19 | Discharge: 2020-04-19 | Disposition: A | Discharge status: Home / Self Care | Payer: Medicare Other | Source: Ambulatory Visit | Attending: Radiation Oncology | Admitting: Radiation Oncology

## 2020-04-19 DIAGNOSIS — Z510 Encounter for antineoplastic radiation therapy: Secondary | ICD-10-CM | POA: Diagnosis not present

## 2020-04-19 DIAGNOSIS — C50411 Malignant neoplasm of upper-outer quadrant of right female breast: Secondary | ICD-10-CM | POA: Diagnosis not present

## 2020-04-19 DIAGNOSIS — Z170 Estrogen receptor positive status [ER+]: Secondary | ICD-10-CM | POA: Diagnosis not present

## 2020-04-19 DIAGNOSIS — C50412 Malignant neoplasm of upper-outer quadrant of left female breast: Secondary | ICD-10-CM | POA: Diagnosis not present

## 2020-04-20 ENCOUNTER — Other Ambulatory Visit: Payer: Self-pay

## 2020-04-20 ENCOUNTER — Ambulatory Visit
Admission: RE | Admit: 2020-04-20 | Discharge: 2020-04-20 | Disposition: A | Discharge status: Home / Self Care | Payer: Medicare Other | Source: Ambulatory Visit | Attending: Radiation Oncology | Admitting: Radiation Oncology

## 2020-04-20 DIAGNOSIS — Z170 Estrogen receptor positive status [ER+]: Secondary | ICD-10-CM | POA: Diagnosis not present

## 2020-04-20 DIAGNOSIS — C50412 Malignant neoplasm of upper-outer quadrant of left female breast: Secondary | ICD-10-CM | POA: Diagnosis not present

## 2020-04-20 DIAGNOSIS — C50411 Malignant neoplasm of upper-outer quadrant of right female breast: Secondary | ICD-10-CM | POA: Diagnosis not present

## 2020-04-20 DIAGNOSIS — Z510 Encounter for antineoplastic radiation therapy: Secondary | ICD-10-CM | POA: Diagnosis not present

## 2020-04-21 ENCOUNTER — Ambulatory Visit
Admission: RE | Admit: 2020-04-21 | Discharge: 2020-04-21 | Disposition: A | Discharge status: Home / Self Care | Payer: Medicare Other | Source: Ambulatory Visit | Attending: Radiation Oncology | Admitting: Radiation Oncology

## 2020-04-21 ENCOUNTER — Encounter: Payer: Self-pay | Admitting: Radiation Oncology

## 2020-04-21 DIAGNOSIS — C50412 Malignant neoplasm of upper-outer quadrant of left female breast: Secondary | ICD-10-CM | POA: Diagnosis not present

## 2020-04-21 DIAGNOSIS — Z170 Estrogen receptor positive status [ER+]: Secondary | ICD-10-CM | POA: Diagnosis not present

## 2020-04-21 DIAGNOSIS — C50411 Malignant neoplasm of upper-outer quadrant of right female breast: Secondary | ICD-10-CM | POA: Diagnosis not present

## 2020-04-21 DIAGNOSIS — Z510 Encounter for antineoplastic radiation therapy: Secondary | ICD-10-CM | POA: Diagnosis not present

## 2020-04-22 ENCOUNTER — Ambulatory Visit
Admission: RE | Admit: 2020-04-22 | Discharge: 2020-04-22 | Disposition: A | Discharge status: Home / Self Care | Payer: Medicare Other | Source: Ambulatory Visit | Attending: Radiation Oncology | Admitting: Radiation Oncology

## 2020-04-22 ENCOUNTER — Other Ambulatory Visit: Payer: Self-pay

## 2020-04-22 DIAGNOSIS — C50411 Malignant neoplasm of upper-outer quadrant of right female breast: Secondary | ICD-10-CM | POA: Diagnosis not present

## 2020-04-22 DIAGNOSIS — Z510 Encounter for antineoplastic radiation therapy: Secondary | ICD-10-CM | POA: Diagnosis not present

## 2020-04-22 DIAGNOSIS — Z170 Estrogen receptor positive status [ER+]: Secondary | ICD-10-CM | POA: Diagnosis not present

## 2020-04-22 DIAGNOSIS — C50412 Malignant neoplasm of upper-outer quadrant of left female breast: Secondary | ICD-10-CM | POA: Diagnosis not present

## 2020-04-24 DIAGNOSIS — C50412 Malignant neoplasm of upper-outer quadrant of left female breast: Secondary | ICD-10-CM | POA: Diagnosis not present

## 2020-04-24 DIAGNOSIS — Z510 Encounter for antineoplastic radiation therapy: Secondary | ICD-10-CM | POA: Diagnosis not present

## 2020-04-24 DIAGNOSIS — C50411 Malignant neoplasm of upper-outer quadrant of right female breast: Secondary | ICD-10-CM | POA: Diagnosis not present

## 2020-04-24 DIAGNOSIS — Z170 Estrogen receptor positive status [ER+]: Secondary | ICD-10-CM | POA: Diagnosis not present

## 2020-04-25 ENCOUNTER — Ambulatory Visit
Admission: RE | Admit: 2020-04-25 | Discharge: 2020-04-25 | Disposition: A | Discharge status: Home / Self Care | Payer: Medicare Other | Source: Ambulatory Visit | Attending: Radiation Oncology | Admitting: Radiation Oncology

## 2020-04-25 DIAGNOSIS — Z170 Estrogen receptor positive status [ER+]: Secondary | ICD-10-CM | POA: Diagnosis not present

## 2020-04-25 DIAGNOSIS — C50412 Malignant neoplasm of upper-outer quadrant of left female breast: Secondary | ICD-10-CM | POA: Diagnosis not present

## 2020-04-25 DIAGNOSIS — Z510 Encounter for antineoplastic radiation therapy: Secondary | ICD-10-CM | POA: Diagnosis not present

## 2020-04-25 DIAGNOSIS — C50411 Malignant neoplasm of upper-outer quadrant of right female breast: Secondary | ICD-10-CM | POA: Diagnosis not present

## 2020-04-26 ENCOUNTER — Ambulatory Visit
Admission: RE | Admit: 2020-04-26 | Discharge: 2020-04-26 | Disposition: A | Discharge status: Home / Self Care | Payer: Medicare Other | Source: Ambulatory Visit | Attending: Radiation Oncology | Admitting: Radiation Oncology

## 2020-04-26 DIAGNOSIS — Z170 Estrogen receptor positive status [ER+]: Secondary | ICD-10-CM | POA: Diagnosis not present

## 2020-04-26 DIAGNOSIS — C50412 Malignant neoplasm of upper-outer quadrant of left female breast: Secondary | ICD-10-CM | POA: Diagnosis not present

## 2020-04-26 DIAGNOSIS — C50411 Malignant neoplasm of upper-outer quadrant of right female breast: Secondary | ICD-10-CM | POA: Diagnosis not present

## 2020-04-26 DIAGNOSIS — Z510 Encounter for antineoplastic radiation therapy: Secondary | ICD-10-CM | POA: Diagnosis not present

## 2020-04-27 ENCOUNTER — Ambulatory Visit
Admission: RE | Admit: 2020-04-27 | Discharge: 2020-04-27 | Disposition: A | Discharge status: Home / Self Care | Payer: Medicare Other | Source: Ambulatory Visit | Attending: Radiation Oncology | Admitting: Radiation Oncology

## 2020-04-27 ENCOUNTER — Other Ambulatory Visit: Payer: Self-pay

## 2020-04-27 DIAGNOSIS — Z510 Encounter for antineoplastic radiation therapy: Secondary | ICD-10-CM | POA: Diagnosis not present

## 2020-04-27 DIAGNOSIS — Z170 Estrogen receptor positive status [ER+]: Secondary | ICD-10-CM | POA: Diagnosis not present

## 2020-04-27 DIAGNOSIS — C50411 Malignant neoplasm of upper-outer quadrant of right female breast: Secondary | ICD-10-CM | POA: Diagnosis not present

## 2020-04-27 DIAGNOSIS — C50412 Malignant neoplasm of upper-outer quadrant of left female breast: Secondary | ICD-10-CM | POA: Diagnosis not present

## 2020-04-28 ENCOUNTER — Other Ambulatory Visit: Payer: Self-pay

## 2020-04-28 ENCOUNTER — Ambulatory Visit
Admission: RE | Admit: 2020-04-28 | Discharge: 2020-04-28 | Disposition: A | Discharge status: Home / Self Care | Payer: Medicare Other | Source: Ambulatory Visit | Attending: Radiation Oncology | Admitting: Radiation Oncology

## 2020-04-28 DIAGNOSIS — C50411 Malignant neoplasm of upper-outer quadrant of right female breast: Secondary | ICD-10-CM | POA: Diagnosis not present

## 2020-04-28 DIAGNOSIS — Z510 Encounter for antineoplastic radiation therapy: Secondary | ICD-10-CM | POA: Diagnosis not present

## 2020-04-28 DIAGNOSIS — Z170 Estrogen receptor positive status [ER+]: Secondary | ICD-10-CM | POA: Diagnosis not present

## 2020-04-28 DIAGNOSIS — C50412 Malignant neoplasm of upper-outer quadrant of left female breast: Secondary | ICD-10-CM | POA: Diagnosis not present

## 2020-05-02 ENCOUNTER — Ambulatory Visit: Payer: Medicare Other

## 2020-05-03 ENCOUNTER — Ambulatory Visit
Admission: RE | Admit: 2020-05-03 | Discharge: 2020-05-03 | Disposition: A | Discharge status: Home / Self Care | Payer: Medicare Other | Source: Ambulatory Visit | Attending: Radiation Oncology | Admitting: Radiation Oncology

## 2020-05-03 ENCOUNTER — Other Ambulatory Visit: Payer: Self-pay

## 2020-05-03 DIAGNOSIS — Z170 Estrogen receptor positive status [ER+]: Secondary | ICD-10-CM | POA: Diagnosis not present

## 2020-05-03 DIAGNOSIS — Z510 Encounter for antineoplastic radiation therapy: Secondary | ICD-10-CM | POA: Diagnosis not present

## 2020-05-03 DIAGNOSIS — C50412 Malignant neoplasm of upper-outer quadrant of left female breast: Secondary | ICD-10-CM | POA: Diagnosis not present

## 2020-05-03 DIAGNOSIS — C50411 Malignant neoplasm of upper-outer quadrant of right female breast: Secondary | ICD-10-CM | POA: Diagnosis not present

## 2020-05-03 MED ORDER — SONAFINE EX EMUL
1.0000 "application " | Freq: Two times a day (BID) | CUTANEOUS | Status: DC
  Administered 2020-05-03: 1 via TOPICAL

## 2020-05-04 ENCOUNTER — Ambulatory Visit
Admission: RE | Admit: 2020-05-04 | Discharge: 2020-05-04 | Disposition: A | Discharge status: Home / Self Care | Payer: Medicare Other | Source: Ambulatory Visit | Attending: Radiation Oncology | Admitting: Radiation Oncology

## 2020-05-04 ENCOUNTER — Ambulatory Visit: Payer: Medicare Other | Admitting: Radiation Oncology

## 2020-05-04 DIAGNOSIS — Z170 Estrogen receptor positive status [ER+]: Secondary | ICD-10-CM | POA: Diagnosis not present

## 2020-05-04 DIAGNOSIS — C50412 Malignant neoplasm of upper-outer quadrant of left female breast: Secondary | ICD-10-CM | POA: Diagnosis not present

## 2020-05-04 DIAGNOSIS — C50411 Malignant neoplasm of upper-outer quadrant of right female breast: Secondary | ICD-10-CM | POA: Diagnosis not present

## 2020-05-04 DIAGNOSIS — Z510 Encounter for antineoplastic radiation therapy: Secondary | ICD-10-CM | POA: Diagnosis not present

## 2020-05-05 ENCOUNTER — Ambulatory Visit
Admission: RE | Admit: 2020-05-05 | Discharge: 2020-05-05 | Disposition: A | Discharge status: Home / Self Care | Payer: Medicare Other | Source: Ambulatory Visit | Attending: Radiation Oncology | Admitting: Radiation Oncology

## 2020-05-05 DIAGNOSIS — C50411 Malignant neoplasm of upper-outer quadrant of right female breast: Secondary | ICD-10-CM | POA: Diagnosis not present

## 2020-05-05 DIAGNOSIS — Z170 Estrogen receptor positive status [ER+]: Secondary | ICD-10-CM | POA: Diagnosis not present

## 2020-05-05 DIAGNOSIS — Z510 Encounter for antineoplastic radiation therapy: Secondary | ICD-10-CM | POA: Diagnosis not present

## 2020-05-05 DIAGNOSIS — C50412 Malignant neoplasm of upper-outer quadrant of left female breast: Secondary | ICD-10-CM | POA: Diagnosis not present

## 2020-05-09 ENCOUNTER — Ambulatory Visit: Payer: Medicare Other

## 2020-05-09 ENCOUNTER — Other Ambulatory Visit: Payer: Self-pay

## 2020-05-09 ENCOUNTER — Ambulatory Visit
Admission: RE | Admit: 2020-05-09 | Discharge: 2020-05-09 | Disposition: A | Discharge status: Home / Self Care | Payer: Medicare Other | Source: Ambulatory Visit | Attending: Radiation Oncology | Admitting: Radiation Oncology

## 2020-05-09 DIAGNOSIS — C50412 Malignant neoplasm of upper-outer quadrant of left female breast: Secondary | ICD-10-CM | POA: Insufficient documentation

## 2020-05-09 DIAGNOSIS — Z170 Estrogen receptor positive status [ER+]: Secondary | ICD-10-CM | POA: Diagnosis not present

## 2020-05-09 DIAGNOSIS — Z510 Encounter for antineoplastic radiation therapy: Secondary | ICD-10-CM | POA: Insufficient documentation

## 2020-05-09 DIAGNOSIS — C50411 Malignant neoplasm of upper-outer quadrant of right female breast: Secondary | ICD-10-CM | POA: Diagnosis not present

## 2020-05-10 ENCOUNTER — Encounter: Payer: Self-pay | Admitting: *Deleted

## 2020-05-10 ENCOUNTER — Ambulatory Visit: Payer: Medicare Other

## 2020-05-10 ENCOUNTER — Other Ambulatory Visit: Payer: Self-pay

## 2020-05-10 ENCOUNTER — Ambulatory Visit
Admission: RE | Admit: 2020-05-10 | Discharge: 2020-05-10 | Disposition: A | Discharge status: Home / Self Care | Payer: Medicare Other | Source: Ambulatory Visit | Attending: Radiation Oncology | Admitting: Radiation Oncology

## 2020-05-10 DIAGNOSIS — C50412 Malignant neoplasm of upper-outer quadrant of left female breast: Secondary | ICD-10-CM | POA: Diagnosis not present

## 2020-05-10 DIAGNOSIS — Z510 Encounter for antineoplastic radiation therapy: Secondary | ICD-10-CM | POA: Diagnosis not present

## 2020-05-10 DIAGNOSIS — Z170 Estrogen receptor positive status [ER+]: Secondary | ICD-10-CM | POA: Diagnosis not present

## 2020-05-10 DIAGNOSIS — C50411 Malignant neoplasm of upper-outer quadrant of right female breast: Secondary | ICD-10-CM | POA: Diagnosis not present

## 2020-05-11 ENCOUNTER — Ambulatory Visit
Admission: RE | Admit: 2020-05-11 | Discharge: 2020-05-11 | Disposition: A | Discharge status: Home / Self Care | Payer: Medicare Other | Source: Ambulatory Visit | Attending: Radiation Oncology | Admitting: Radiation Oncology

## 2020-05-11 DIAGNOSIS — C50411 Malignant neoplasm of upper-outer quadrant of right female breast: Secondary | ICD-10-CM | POA: Diagnosis not present

## 2020-05-11 DIAGNOSIS — C50412 Malignant neoplasm of upper-outer quadrant of left female breast: Secondary | ICD-10-CM | POA: Diagnosis not present

## 2020-05-11 DIAGNOSIS — H353131 Nonexudative age-related macular degeneration, bilateral, early dry stage: Secondary | ICD-10-CM | POA: Diagnosis not present

## 2020-05-11 DIAGNOSIS — H25813 Combined forms of age-related cataract, bilateral: Secondary | ICD-10-CM | POA: Diagnosis not present

## 2020-05-11 DIAGNOSIS — Z170 Estrogen receptor positive status [ER+]: Secondary | ICD-10-CM | POA: Diagnosis not present

## 2020-05-11 DIAGNOSIS — Z510 Encounter for antineoplastic radiation therapy: Secondary | ICD-10-CM | POA: Diagnosis not present

## 2020-05-12 ENCOUNTER — Other Ambulatory Visit: Payer: Self-pay

## 2020-05-12 ENCOUNTER — Ambulatory Visit: Payer: Medicare Other

## 2020-05-12 ENCOUNTER — Ambulatory Visit
Admission: RE | Admit: 2020-05-12 | Discharge: 2020-05-12 | Disposition: A | Discharge status: Home / Self Care | Payer: Medicare Other | Source: Ambulatory Visit | Attending: Radiation Oncology | Admitting: Radiation Oncology

## 2020-05-12 DIAGNOSIS — Z510 Encounter for antineoplastic radiation therapy: Secondary | ICD-10-CM | POA: Diagnosis not present

## 2020-05-12 DIAGNOSIS — C50411 Malignant neoplasm of upper-outer quadrant of right female breast: Secondary | ICD-10-CM | POA: Diagnosis not present

## 2020-05-12 DIAGNOSIS — C50412 Malignant neoplasm of upper-outer quadrant of left female breast: Secondary | ICD-10-CM | POA: Diagnosis not present

## 2020-05-12 DIAGNOSIS — Z170 Estrogen receptor positive status [ER+]: Secondary | ICD-10-CM | POA: Diagnosis not present

## 2020-05-13 ENCOUNTER — Other Ambulatory Visit: Payer: Self-pay

## 2020-05-13 ENCOUNTER — Encounter: Payer: Self-pay | Admitting: Radiation Oncology

## 2020-05-13 ENCOUNTER — Ambulatory Visit
Admission: RE | Admit: 2020-05-13 | Discharge: 2020-05-13 | Disposition: A | Discharge status: Home / Self Care | Payer: Medicare Other | Source: Ambulatory Visit | Attending: Radiation Oncology | Admitting: Radiation Oncology

## 2020-05-13 DIAGNOSIS — C50412 Malignant neoplasm of upper-outer quadrant of left female breast: Secondary | ICD-10-CM | POA: Diagnosis not present

## 2020-05-13 DIAGNOSIS — C50411 Malignant neoplasm of upper-outer quadrant of right female breast: Secondary | ICD-10-CM | POA: Diagnosis not present

## 2020-05-13 DIAGNOSIS — Z510 Encounter for antineoplastic radiation therapy: Secondary | ICD-10-CM | POA: Diagnosis not present

## 2020-05-13 DIAGNOSIS — Z170 Estrogen receptor positive status [ER+]: Secondary | ICD-10-CM | POA: Diagnosis not present

## 2020-05-16 NOTE — Progress Notes (Signed)
  Radiation Oncology         [phone]) [phone] ________________________________  Name: Sheila Nguyen MRN: [id number]  Date: 04/21/2020  DOB: 1945/08/17  SIMULATION NOTE   NARRATIVE:  The patient underwent simulation today for ongoing radiation therapy.  The existing CT study set was employed for the purpose of virtual treatment planning.  The target and avoidance structures were reviewed and modified as necessary.  Treatment planning then occurred.  The radiation boost prescription was entered and confirmed.  A total of 6 complex treatment devices were fabricated in the form of multi-leaf collimators to shape radiation around the targets while maximally excluding nearby normal structure:  3 fields for the left breast boost and 3 fields for the right breast boosts. I have requested : Isodose Plan.    PLAN:  This modified radiation beam arrangement is intended to continue the current radiation dose to an additional 8 Gy in 4 fractions to each side for a total cumulative dose of 50.56 Gy.    ------------------------------------------------  Jodelle Gross, MD, PhD

## 2020-05-20 DIAGNOSIS — Z1152 Encounter for screening for COVID-19: Secondary | ICD-10-CM | POA: Diagnosis not present

## 2020-05-31 NOTE — Progress Notes (Signed)
  Radiation Oncology         [phone]) [phone] ________________________________  Name: Sheila Nguyen MRN: [id number]  Date: 05/13/2020  DOB: [birth year]  End of Treatment Note  Diagnosis:   bilateral-sided breast cancer     Indication for treatment:  Curative       Radiation treatment dates:   04/13/20 - 05/13/20  Site/dose:   The patient initially received a dose of 42.56 Gy in 16 fractions to the bilateral breasts using whole-breast tangent fields. This was delivered using a 3-D conformal technique. The patient then received a boost to each seroma. This delivered an additional 8 Gy in 4 fractions using a 3 field photon technique on each side due to the depth of the seroma. The total dose to each breast was 50.56 Gy.  Narrative: The patient tolerated radiation treatment relatively well.   The patient had some expected skin irritation as she progressed during treatment.   Plan: The patient has completed radiation treatment. The patient will return to radiation oncology clinic for routine followup in one month. I advised the patient to call or return sooner if they have any questions or concerns related to their recovery or treatment. ________________________________  Jodelle Gross, M.D., Ph.D.

## 2020-06-01 ENCOUNTER — Other Ambulatory Visit: Payer: Self-pay

## 2020-06-01 ENCOUNTER — Encounter: Payer: Self-pay | Admitting: Dermatology

## 2020-06-01 ENCOUNTER — Ambulatory Visit: Payer: Medicare Other | Admitting: Dermatology

## 2020-06-01 DIAGNOSIS — D1801 Hemangioma of skin and subcutaneous tissue: Secondary | ICD-10-CM | POA: Diagnosis not present

## 2020-06-01 DIAGNOSIS — Z1283 Encounter for screening for malignant neoplasm of skin: Secondary | ICD-10-CM

## 2020-06-01 DIAGNOSIS — L821 Other seborrheic keratosis: Secondary | ICD-10-CM

## 2020-06-01 DIAGNOSIS — L720 Epidermal cyst: Secondary | ICD-10-CM

## 2020-06-01 NOTE — Progress Notes (Signed)
[name]  Telephone:([phone]) [phone] Fax:(336) [phone]     ID: RIKO LUMSDEN DOB: 08-29-1945  MR#: [id number]  EVO#:[id number]  Patient Care Team: Tonia Ghent, MD as PCP - General (Family Medicine) Mauro Kaufmann, RN as Oncology Nurse Navigator Rockwell Germany, RN as Oncology Nurse Navigator Coralie Keens, MD as Consulting Physician (General Surgery) Truitt Merle, MD as Consulting Physician (Hematology) Kyung Rudd, MD as Consulting Physician (Radiation Oncology) Chauncey Cruel, MD OTHER MD:  CHIEF COMPLAINT: Estrogen receptor positive breast cancer  CURRENT TREATMENT: Anastrozole   HISTORY OF CURRENT ILLNESS: Sheila Nguyen had routine screening mammography on 12/02/2019 showing a possible abnormality in the bilateral breasts. She underwent bilateral diagnostic mammography with tomography and bilateral breast ultrasonography at [facility] on 12/14/2019 showing: breast density category B; 3 mm irregular mass in right breast at 9:30; cysts in left breast at 12 o'clock on ultrasound have irregular margins on mammogram; benign-appearing lymph nodes in right axilla.  Accordingly on 01/06/2020 she proceeded to biopsy of the bilateral breast areas in question. The pathology from this procedure ([id number]) showed:  1. 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 Proliferation marker Ki67 at 1%. HER2 negative by immunohistochemistry (1+). 2. Right Breast  - invasive ductal carcinoma, grade 1  - ductal carcinoma in situ  - Prognostic indicators significant for: estrogen receptor, 80% positive and progesterone receptor, 10% positive, both with strong staining intensity. Proliferation marker Ki67 at 1%. HER2 negative by immunohistochemistry (1+).  She underwent bilateral lumpectomies on  02/11/2020 under Dr. Ninfa Linden. Pathology from the procedure [id number]) showed: 1. Left Breast  - ductal carcinoma in situ, intermediate grade  - columnar cell and fibrocystic changes with apocrine metaplasia  - focal atypical ductal hyperplasia  - lobular neoplasia   - no residual carcinoma 2. Right Breast  - ductal carcinoma in situ, intermediate grade  - adenosis, columnar cell, and fibrocystic changes  - no residual carcinoma  She subsequently received radiation therapy from 04/13/2020 through 05/13/2020 under Dr. Lisbeth Renshaw.  Cancer Staging Malignant neoplasm of upper-outer quadrant of left breast in female, estrogen receptor positive ([place]) Staging form: Breast, AJCC 8th Edition - Clinical stage from 01/13/2020: Stage IA (cT1b, cN0, cM0, G1, ER+, PR-, HER2-) - Unsigned  Malignant neoplasm of upper-outer quadrant of right breast in female, estrogen receptor positive ([place]) Staging form: Breast, AJCC 8th Edition - Clinical stage from 01/13/2020: Stage IA (cT1a, cN0, cM0, G1, ER+, PR+, HER2-) - Unsigned  The patient's subsequent history is as detailed below.   INTERVAL HISTORY: [name] was evaluated in the multidisciplinary breast cancer clinic on 01/13/2020 by Dr. Burr Medico.  She is seeking today to establish herself in my practice.   REVIEW OF SYSTEMS: The patient denies unusual headaches, visual changes, nausea, vomiting, stiff neck, dizziness, or gait imbalance. There has been no cough, phlegm production, or pleurisy, no chest pain or pressure, and no change in bowel or bladder habits. The patient denies fever, rash, bleeding, unexplained fatigue or unexplained weight loss. A detailed review of systems was otherwise entirely negative.   COVID 19 VACCINATION STATUS: Moderna x2, with booster September 2021   PAST MEDICAL HISTORY: Past Medical History:  Diagnosis Date  . Anxiety   . Cancer ([place]) 12/2019   Bilateral IDC with DCIS  . Depression   . Hyperlipidemia   . Hypertension   .  Urinary incontinence    With coughing and sneezing    PAST SURGICAL HISTORY: Past Surgical History:  Procedure Laterality Date  . 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 BREAST LUMPECTOMY WITH RADIOACTIVE SEED LOCALIZATION Bilateral 02/11/2020   Procedure: BILATERAL BREAST LUMPECTOMY WITH RADIOACTIVE SEED LOCALIZATIONS;  Surgeon: Coralie Keens, MD;  Location: [facility];  Service: General;  Laterality: Bilateral;  . CESAREAN SECTION  1979    FAMILY HISTORY: Family History  Problem Relation Age of Onset  . Alcohol abuse Father   . Stroke Father   . Colon cancer Neg Hx   . Breast cancer Neg Hx   The patient's father died at the age of 27 from a stroke.  The patient's mother died at age 90.  The patient has 1 brother, no sisters.  She is not aware of any cancer history in the family   GYNECOLOGIC HISTORY:  No LMP recorded. Patient is postmenopausal. Menarche: 75 years old Age at first live birth: 75 years old GX P 1 (+1 adopted child). LMP in her late 61's Contraceptive: used for 3 years HRT yes  Hysterectomy? no BSO? no   SOCIAL HISTORY: (updated January 2022 Sheila Nguyen is originally from [place].  She moved to [place] approximately age 50.  She worked as a Oceanographer.  Her husband died from urothelial cancer in the setting of tobacco abuse.  She lives by herself.  Patient is adopted daughter Sheila Nguyen Mercury is a Animal nutritionist in [place], currently 75 years old.  The patient's son Sheila Nguyen works for [employer] in [place].  He is 77 years old.  The patient has no grandchildren.  She is a Tourist information centre manager    ADVANCED DIRECTIVES:    HEALTH MAINTENANCE: Social History   Tobacco Use  . Smoking status: Never Smoker  . Smokeless tobacco: Never Used  Substance Use Topics  . Alcohol use: No    Alcohol/week: 0.0 standard drinks  . Drug use: No     Colonoscopy: Refuses  PAP: Remote  Bone density: 09/2017   Allergies  Allergen Reactions  . Other     Almond, lemon,  chocolate, barley  . Amoxicillin Rash    Flushed face/fevery  . Wellbutrin [Bupropion] Other (See Comments)    Max tolerated dose is 163m daily.  Irritable on higher dose.     Current Outpatient Medications  Medication Sig Dispense Refill  . ALPRAZolam (XANAX) 0.25 MG tablet TAKE 1 TABLET BY MOUTH DAILY AS NEEDED FOR ANXIETY( FOR FLIGHTS IF NEEDED). MAY CAUSE DROWSINESS. 10 tablet 1  . aspirin EC 81 MG tablet Take 81 mg by mouth daily. Swallow whole.    .Marland KitchenbuPROPion (WELLBUTRIN SR) 100 MG 12 hr tablet Take 1 tablet (100 mg total) by mouth daily. 90 tablet 3  . calcium carbonate (OS-CAL) 1250 (500 Ca) MG chewable tablet Chew 1 tablet by mouth 2 (two) times daily. Patients states that she is taking 600 mg twice daily    . citalopram (CELEXA) 10 MG tablet Take 1 tablet (10 mg total) by mouth daily. 90 tablet 3  . FIBER FORMULA PO Take 1 capsule by mouth 2 (two) times daily.    .Marland Kitchenolmesartan (BENICAR) 5 MG tablet TAKE 1 TABLET(5 MG) BY MOUTH DAILY. 90 tablet 1  . pravastatin (PRAVACHOL) 40 MG tablet 1 tab by mouth at night. 90 tablet 3  . traMADol (ULTRAM) 50 MG tablet Take 1 tablet (50 mg total) by mouth every 6 (six) hours as needed for moderate pain or  severe pain. (Patient not taking: Reported on 03/15/2020) 20 tablet 1   No current facility-administered medications for this visit.    OBJECTIVE: White woman who appears younger than stated age  5:   06/02/20 1333  BP: (!) 151/67  Pulse: 81  Resp: 18  Temp: (!) 97.3 F (36.3 C)  SpO2: 98%     Body mass index is 30.09 kg/m.   Wt Readings from Last 3 Encounters:  06/02/20 167 lb 3.2 oz (75.8 kg)  03/15/20 166 lb 9.6 oz (75.6 kg)  02/11/20 166 lb 10.7 oz (75.6 kg)      ECOG FS:1 - Symptomatic but completely ambulatory  Ocular: Sclerae unicteric, pupils round and equal Ear-nose-throat: Wearing a mask Lymphatic: No cervical or supraclavicular adenopathy Lungs no rales or rhonchi Heart regular rate and rhythm Abd soft,  nontender, positive bowel sounds MSK no focal spinal tenderness, no joint edema Neuro: non-focal, well-oriented, appropriate affect Breasts: Status post bilateral lumpectomies and bilateral radiation therapy.  The cosmetic result is excellent.  There is no evidence of local recurrence.  Both axillae are benign.   LAB RESULTS:  CMP     Component Value Date/Time   NA 140 01/13/2020 1218   K 4.0 01/13/2020 1218   CL 106 01/13/2020 1218   CO2 25 01/13/2020 1218   GLUCOSE 147 (H) 01/13/2020 1218   BUN 12 01/13/2020 1218   CREATININE 0.90 01/13/2020 1218   CALCIUM 9.5 01/13/2020 1218   PROT 7.4 01/13/2020 1218   ALBUMIN 3.9 01/13/2020 1218   AST 17 01/13/2020 1218   ALT 25 01/13/2020 1218   ALKPHOS 119 01/13/2020 1218   BILITOT 0.4 01/13/2020 1218   GFRNONAA >60 01/13/2020 1218   GFRAA >60 01/13/2020 1218    No results found for: TOTALPROTELP, ALBUMINELP, A1GS, A2GS, BETS, BETA2SER, GAMS, MSPIKE, SPEI  Lab Results  Component Value Date   WBC 7.2 01/13/2020   NEUTROABS 4.4 01/13/2020   HGB 14.9 01/13/2020   HCT 45.9 01/13/2020   MCV 91.4 01/13/2020   PLT 248 01/13/2020    No results found for: LABCA2  No components found for: RDEYCX448  No results for input(s): INR in the last 168 hours.  No results found for: LABCA2  No results found for: JEH631  No results found for: SHF026  No results found for: VZC588  No results found for: CA2729  No components found for: HGQUANT  No results found for: CEA1 / No results found for: CEA1   No results found for: AFPTUMOR  No results found for: CHROMOGRNA  No results found for: KPAFRELGTCHN, LAMBDASER, KAPLAMBRATIO (kappa/lambda light chains)  No results found for: HGBA, HGBA2QUANT, HGBFQUANT, HGBSQUAN (Hemoglobinopathy evaluation)   No results found for: LDH  No results found for: IRON, TIBC, IRONPCTSAT (Iron and TIBC)  No results found for: FERRITIN  Urinalysis    Component Value Date/Time   COLORURINE  YELLOW 05/18/2013 1037   APPEARANCEUR CLEAR 05/18/2013 1037   LABSPEC 1.006 05/18/2013 1037   PHURINE 7.0 05/18/2013 1037   GLUCOSEU NEG 05/18/2013 1037   HGBUR NEG 05/18/2013 1037   BILIRUBINUR NEG 05/18/2013 1037   KETONESUR NEG 05/18/2013 1037   PROTEINUR NEG 05/18/2013 1037   UROBILINOGEN 0.2 05/18/2013 1037   NITRITE NEG 05/18/2013 1037   LEUKOCYTESUR MOD (A) 05/18/2013 1037     STUDIES: No results found.   ELIGIBLE FOR AVAILABLE RESEARCH PROTOCOL: no  ASSESSMENT: 75 y.o. [place] woman status post bilateral breast biopsies 01/06/2020, showing  (1) on the right, an  invasive ductal carcinoma, grade 1, measuring 0.4 cm (T1a), estrogen and progesterone receptor positive, with an MIB-1 of 1% and HER-2 not amplified  (2 on the left, and invasive ductal carcinoma, grade 1, measuring 1.0 cm. (T1b), estrogen receptor positive, progesterone receptor negative, with an MIB-1 of 1%.  HER-2 was not amplified  (3) status post bilateral lumpectomies 02/11/2020 showing bilateral ductal carcinoma in situ, both grade 2, with no evidence of invasive disease, and negative margins  (4) adjuvant radiation 04/13/20 - 05/13/20 Site/dose:   The patient initially received a dose of 42.56 Gy in 16 fractions to the bilateral breasts using whole-breast tangent fields. This was delivered using a 3-D conformal technique. The patient then received a boost to each seroma. This delivered an additional 8 Gy in 4 fractions using a 3 field photon technique on each side due to the depth of the seroma. The total dose to each breast was 50.56 Gy.  (5) anastrozole started 06/02/2020  (a) [facility] at [facility] 01/27/2020 showed a T score of -1.5    PLAN: Crystallynn did very well with her surgery and today we reviewed her pathology.  She understands the larger tumor was 1.0 cm and progesterone receptor negative.  She understands the benefits of radiation which she received.  Overall her prognosis is good even without  antiestrogens but it is better with antiestrogens and of course antiestrogens also will cut in half her risk of developing another breast cancer in the future.  Since she had bilateral breast cancers that risk may be greater than 1 %/year.  We discussed the difference between tamoxifen and anastrozole in detail. She understands that anastrozole and the aromatase inhibitors in general work by blocking estrogen production. Accordingly vaginal dryness, decrease in bone density, and of course hot flashes can result. The aromatase inhibitors can also negatively affect the cholesterol profile, although that is a minor effect. One out of 5 women on aromatase inhibitors we will feel "old and achy". This arthralgia/myalgia syndrome, which resembles fibromyalgia clinically, does resolve with stopping the medications. Accordingly this is not a reason to not try an aromatase inhibitor but it is a frequent reason to stop it (in other words 20% of women will not be able to tolerate these medications).  Tamoxifen on the other hand does not block estrogen production. It does not "take away a woman's estrogen". It blocks the estrogen receptor in breast cells. Like anastrozole, it can also cause hot flashes. As opposed to anastrozole, tamoxifen has many estrogen-like effects. It is technically an estrogen receptor modulator. This means that in some tissues tamoxifen works like estrogen-- for example it helps strengthen the bones. It tends to improve the cholesterol profile. It can cause thickening of the endometrial lining, and even endometrial polyps or rarely cancer of the uterus.(The risk of uterine cancer due to tamoxifen is one additional cancer per thousand women year). It can cause vaginal wetness or stickiness. It can cause blood clots through this estrogen-like effect--the risk of blood clots with tamoxifen is exactly the same as with birth control pills or hormone replacement.  Neither of these agents causes mood  changes or weight gain, despite the popular belief that they can have these side effects. We have data from studies comparing either of these drugs with placebo, and in those cases the control group had the same amount of weight gain and depression as the group that took the drug.  After this discussion she felt that she would be more comfortable with anastrozole  and I have placed the prescription.  We will have a virtual meeting in April to see how she is tolerating it and if she does well the plan will be to continue that for 5 years.  She will have her next mammogram in July and see me in August for routine follow-up  Total encounter time 65 minutes.Sarajane Jews C. Dailah Opperman, MD 06/02/2020 1:43 PM Medical Oncology and Hematology [facility] [place], [place] [address] Tel. [phone]    Fax. [phone]   This document serves as a record of services personally performed by Lurline Del, MD. It was created on his behalf by Wilburn Mylar, a trained medical scribe. The creation of this record is based on the scribe's personal observations and the provider's statements to them.   I, Lurline Del MD, have reviewed the above documentation for accuracy and completeness, and I agree with the above.    *Total Encounter Time as defined by the Centers for Medicare and Medicaid Services includes, in addition to the face-to-face time of a patient visit (documented in the note above) non-face-to-face time: obtaining and reviewing outside history, ordering and reviewing medications, tests or procedures, care coordination (communications with other health care professionals or caregivers) and documentation in the medical record.

## 2020-06-02 ENCOUNTER — Other Ambulatory Visit: Payer: Self-pay

## 2020-06-02 ENCOUNTER — Inpatient Hospital Stay: Payer: Medicare Other | Attending: Oncology | Admitting: Oncology

## 2020-06-02 VITALS — BP 151/67 | HR 81 | Temp 97.3°F | Resp 18 | Ht 62.5 in | Wt 167.2 lb

## 2020-06-02 DIAGNOSIS — M8580 Other specified disorders of bone density and structure, unspecified site: Secondary | ICD-10-CM

## 2020-06-02 DIAGNOSIS — E785 Hyperlipidemia, unspecified: Secondary | ICD-10-CM | POA: Diagnosis not present

## 2020-06-02 DIAGNOSIS — E7800 Pure hypercholesterolemia, unspecified: Secondary | ICD-10-CM | POA: Diagnosis not present

## 2020-06-02 DIAGNOSIS — Z7982 Long term (current) use of aspirin: Secondary | ICD-10-CM | POA: Diagnosis not present

## 2020-06-02 DIAGNOSIS — Z79899 Other long term (current) drug therapy: Secondary | ICD-10-CM | POA: Insufficient documentation

## 2020-06-02 DIAGNOSIS — I10 Essential (primary) hypertension: Secondary | ICD-10-CM | POA: Insufficient documentation

## 2020-06-02 DIAGNOSIS — L659 Nonscarring hair loss, unspecified: Secondary | ICD-10-CM

## 2020-06-02 DIAGNOSIS — C50411 Malignant neoplasm of upper-outer quadrant of right female breast: Secondary | ICD-10-CM | POA: Insufficient documentation

## 2020-06-02 DIAGNOSIS — Z170 Estrogen receptor positive status [ER+]: Secondary | ICD-10-CM | POA: Diagnosis not present

## 2020-06-02 DIAGNOSIS — Z800 Family history of malignant neoplasm of digestive organs: Secondary | ICD-10-CM | POA: Diagnosis not present

## 2020-06-02 DIAGNOSIS — C50412 Malignant neoplasm of upper-outer quadrant of left female breast: Secondary | ICD-10-CM | POA: Insufficient documentation

## 2020-06-02 DIAGNOSIS — F418 Other specified anxiety disorders: Secondary | ICD-10-CM | POA: Insufficient documentation

## 2020-06-02 MED ORDER — ANASTROZOLE 1 MG PO TABS: 1.0000 mg | ORAL_TABLET | Freq: Every day | ORAL | 4 refills | Status: DC

## 2020-06-07 ENCOUNTER — Encounter: Payer: Self-pay | Admitting: Family Medicine

## 2020-06-07 NOTE — Telephone Encounter (Signed)
Please call and triage patient.  If she passed out, then I think she needs eval in person.  Thanks.

## 2020-06-07 NOTE — Telephone Encounter (Signed)
I spoke with Sheila Nguyen who said she did loss consciousness the night of taking the shingrix shot; Sheila Nguyen was not positive how long she was out; Sheila Nguyen said she does have low grade fever and Sheila Nguyen was coughing while on phone. Sheila Nguyen is going to go to [facility] for face to face eval; Sheila Nguyen also hit her arm on a dresser when fell to floor when passed out on 06/05/20. FYI to Dr Damita Dunnings.

## 2020-06-08 NOTE — Telephone Encounter (Signed)
Please get update on patient Thursday.  See documentation from [place] below.  Thanks.

## 2020-06-10 ENCOUNTER — Encounter: Payer: Self-pay | Admitting: Dermatology

## 2020-06-10 ENCOUNTER — Telehealth: Payer: Self-pay | Admitting: Oncology

## 2020-06-10 NOTE — Telephone Encounter (Signed)
Informed patient of her upcoming appointments. Patient is aware. 

## 2020-06-10 NOTE — Progress Notes (Signed)
   Follow-Up Visit   Subjective  Sheila Nguyen is a 75 y.o. female who presents for the following: Annual Exam (No new conerns).  Annual skin examination Location: Perhaps new spot chest Duration:  Quality:  Associated Signs/Symptoms: Modifying Factors:  Severity:  Timing: Context:   Objective  Well appearing patient in no apparent distress; mood and affect are within normal limits. Objective  Chest - Medial [facility]), Mid Back: Brown 6 mm flattopped textured papules  Objective  Right Thigh - Anterior: 3 mm smooth red papule  Objective  Chest - Medial [facility]): All skin except below undergarments examined; no atypical moles, melanoma, nonmobile skin cancer.  Objective  Mid Forehead: 1 mm white dermal papule    A full examination was performed including scalp, head, eyes, ears, nose, lips, neck, chest, axillae, abdomen, back, buttocks, bilateral upper extremities, bilateral lower extremities, hands, feet, fingers, toes, fingernails, and toenails. All findings within normal limits unless otherwise noted below.  Area below undergarments not examined.   Assessment & Plan    Seborrheic keratosis (2) Chest - Medial [facility]); Mid Back  Leave if stable  Cherry angioma Right Thigh - Anterior  Leave if stable  Screening exam for skin cancer Chest - Medial [facility])  Annual skin examination  [place] Mid Forehead  Patient given option of having lesion removed; defers for now.      I, Lavonna Monarch, MD, have reviewed all documentation for this visit.  The documentation on 06/10/20 for the exam, diagnosis, procedures, and orders are all accurate and complete.

## 2020-06-13 ENCOUNTER — Ambulatory Visit
Admission: RE | Admit: 2020-06-13 | Discharge: 2020-06-13 | Disposition: A | Discharge status: Home / Self Care | Payer: Medicare Other | Source: Ambulatory Visit | Attending: Oncology | Admitting: Oncology

## 2020-06-13 DIAGNOSIS — Z170 Estrogen receptor positive status [ER+]: Secondary | ICD-10-CM

## 2020-06-13 DIAGNOSIS — C50411 Malignant neoplasm of upper-outer quadrant of right female breast: Secondary | ICD-10-CM

## 2020-06-13 NOTE — Progress Notes (Signed)
  Radiation Oncology         [phone]) [phone] ________________________________  Name: Sheila Nguyen MRN: [id number]  Date of Service: 06/13/2020  DOB: 22-Jul-1945  Post Treatment Telephone Note  Diagnosis:  Stage IA, cT1aN0M0 grade 1, ER/PR positive invasive ductal carcinoma with DCIS of the right breast, and synchronous Stage IA, cT1bN0M0 grade 1, ER positive invasive ductal carcinoma of the left breast  Interval Since Last Radiation:  5 weeks   04/13/20 - 05/13/20: The patient initially received a dose of 42.56 Gy in 16 fractions to the bilateral breasts using whole-breast tangent fields. This was delivered using a 3-D conformal technique. The patient then received a boost to each seroma. This delivered an additional 8 Gy in 4 fractions using a 3 field photon technique on each side due to the depth of the seroma. The total dose to each breast was 50.56 Gy.  Narrative:  The patient was contacted today for routine follow-up. During treatment she did very well with radiotherapy and did not have significant desquamation. She reports she is doing well, her skin is healing with persistent hyperpigmentation.  Impression/Plan: 1. 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 The patient has been doing well since completion of radiotherapy. We discussed that we would be happy to continue to follow her as needed, but she will also continue to follow up with Dr. Jana Hakim in medical oncology. She was counseled on skin care as well as measures to avoid sun exposure to this area.  2. Survivorship. We discussed the importance of survivorship evaluation and encouraged her to attend her upcoming visit with that clinic.     Carola Rhine, PAC

## 2020-06-13 NOTE — Telephone Encounter (Signed)
If she passed out, then at a minimum I think we need to take a look at her at the clinic.  Please see about getting her scheduled here and make sure she does not have any Covid exclusions that would prevent in person evaluation.  Thanks.

## 2020-06-16 ENCOUNTER — Encounter: Payer: Self-pay | Admitting: Family Medicine

## 2020-06-16 ENCOUNTER — Ambulatory Visit (INDEPENDENT_AMBULATORY_CARE_PROVIDER_SITE_OTHER): Payer: Medicare Other | Admitting: Family Medicine

## 2020-06-16 ENCOUNTER — Other Ambulatory Visit: Payer: Self-pay

## 2020-06-16 VITALS — BP 122/72 | HR 81 | Temp 97.3°F | Ht 62.5 in | Wt 163.0 lb

## 2020-06-16 DIAGNOSIS — R42 Dizziness and giddiness: Secondary | ICD-10-CM

## 2020-06-16 DIAGNOSIS — R55 Syncope and collapse: Secondary | ICD-10-CM

## 2020-06-16 DIAGNOSIS — R079 Chest pain, unspecified: Secondary | ICD-10-CM

## 2020-06-16 NOTE — Patient Instructions (Signed)
Take care.  Glad to see you. Update me as needed.  

## 2020-06-16 NOTE — Progress Notes (Signed)
This visit occurred during the SARS-CoV-2 public health emergency.  Safety protocols were in place, including screening questions prior to the visit, additional usage of staff PPE, and extensive cleaning of exam room while observing appropriate contact time as indicated for disinfecting solutions.  She had shingles shot.  Later that night she had chills but it resolved.  Next AM got up from bed, went to BR.  After getting out of bathroom she was walking tot the kitchen.  Then felt shaky and felt weak.  Woke up flat on her back.  Scraped L arm.  She had bruising around R orbit.  She was able to get up and get to her chair, then felt well later that afternoon.  She had prodromal sx. no chest pain.  She had similar event with 2nd covid shot but nearly passed out- not full syncope.  Not with other shingrix or covid vaccines.   Episodes were about 1 year apart.  No other events.   No FCNAV.  No CP, not SOB.  She isn't lightheaded.    No symptoms now.  EKG reviewed and compared to previous without significant change.  She been told once before that she had a murmur.  See exam.  We talked about the fact that it is possible to have a murmur that is only intermittently audible.  No history of seizure.  No history of seizure-like movement.  We talked about routine lab work and she wanted to defer this today.  Meds, vitals, and allergies reviewed.   ROS: Per HPI unless specifically indicated in ROS section   GEN: nad, alert and oriented HEENT: ncat NECK: supple w/o LA CV: rrr.  I do not appreciate a murmur on exam. PULM: ctab, no inc wob ABD: soft, +bs EXT: no edema SKIN: no acute rash

## 2020-06-17 ENCOUNTER — Other Ambulatory Visit: Payer: Self-pay | Admitting: Family Medicine

## 2020-06-19 DIAGNOSIS — R55 Syncope and collapse: Secondary | ICD-10-CM | POA: Insufficient documentation

## 2020-06-19 NOTE — Assessment & Plan Note (Addendum)
She had episodes after vaccinations as described above.  I suspect that she had a combination of factors that caused vasovagal syncope, with prodrome.  I presume she had an inflammatory reaction that would be expected from vaccination.  The next morning I am assuming her blood pressure was relatively low as she had been supine.  She got up went to the bathroom, use the bathroom, and then got up.  That is when her symptoms started and multiple factors could have made her blood pressure transiently low.  Discussed.  Goal to avoid hypotension.  Routine cautions given to patient.  EKG without significant change.  I will update cardiology for input.  She declined lab work at this point.  She has no chest pain and feels well otherwise.  Pathophysiology of vasovagal syncope discussed with patient.  No murmur appreciated on exam.  Discussed with patient.  No change in meds at this point.  30 minutes were devoted to patient care in this encounter (this includes time spent reviewing the patient's file/history, interviewing and examining the patient, counseling/reviewing plan with patient).

## 2020-06-24 ENCOUNTER — Telehealth: Payer: Self-pay | Admitting: Family Medicine

## 2020-06-24 NOTE — Telephone Encounter (Signed)
Notify patient. I have been considering her situation in the meantime/checking on options.  I do not think her previous episodes would prevent her from getting vaccinated in the future. She would need to make sure she is drinking plenty of water, enough to keep her urine clear. This would be especially important around the time of any vaccine. She could also add a little more salt to her food before and after vaccination.  If she had more symptoms or another episode where she gets lightheaded we could consider sending her to cardiology for outpatient monitoring where she would wear a patch to monitor her heart rate. We could also consider getting an echocardiogram. I do not think we have to do any of that right now. Please update me as needed. Thanks.

## 2020-06-27 NOTE — Telephone Encounter (Signed)
Called and left detailed voicemail (ok per DPR) explaining Dr. Josefine Class recommendations. Instructed patient to call back with any further questions or concerns.

## 2020-07-05 ENCOUNTER — Encounter: Payer: Self-pay | Admitting: Family Medicine

## 2020-07-06 ENCOUNTER — Other Ambulatory Visit: Payer: Self-pay | Admitting: Family Medicine

## 2020-07-06 MED ORDER — OLMESARTAN MEDOXOMIL 5 MG PO TABS: 5.0000 mg | ORAL_TABLET | Freq: Every day | ORAL | Status: DC

## 2020-08-21 NOTE — Progress Notes (Signed)
Sheila Nguyen  [phone]) [phone] [phone]) [phone]     ID: Sheila Nguyen DOB: 1945/10/27  MR#: [id number]  YHO#:[id number]  Patient Care Team: Tonia Ghent, MD as PCP - General (Family Medicine) Mauro Kaufmann, RN as Oncology Nurse Navigator Rockwell Germany, RN as Oncology Nurse Navigator Coralie Keens, MD as Consulting Physician (General Surgery) Truitt Merle, MD as Consulting Physician (Hematology) Kyung Rudd, MD as Consulting Physician (Radiation Oncology) Michail Boyte, Sheila Dad, MD as Consulting Physician (Oncology) Chauncey Cruel, MD OTHER MD:  I connected with Ali Lowe on 08/22/20 at  2:00 PM EDT by video enabled telemedicine visit and verified that I am speaking with the correct person using two identifiers.   I discussed the limitations, risks, security and privacy concerns of performing an evaluation and management service by telemedicine and the availability of in-person appointments. I also discussed with the patient that there may be a patient responsible charge related to this service. The patient expressed understanding and agreed to proceed.   Other persons participating in the visit and their role in the encounter: None  Patient's location: Home Provider's location: [place]  Total time spent: 50 min   CHIEF COMPLAINT: Estrogen receptor positive breast cancer  CURRENT TREATMENT: Anastrozole   INTERVAL HISTORY: Sheila Nguyen was contacted today for follow up of her estrogen receptor positive breast cancer.  She was started on anastrozole at her last visit on 06/02/2020.   REVIEW OF SYSTEMS: Sheila Nguyen is tolerating anastrozole generally well.  She does have hot flashes.  There were some days than others.  They are mostly during the day.  They do not wake her up at night.  Vaginal dryness is not an issue.  She has not experienced any arthralgias or myalgias.  For exercise she walks in the [place]" an hour at a time.   A detailed review of systems today was otherwise stable.   COVID 19 VACCINATION STATUS: Moderna x2, with booster September 2021   HISTORY OF CURRENT ILLNESS: From a prior intake note:  Sheila Nguyen had routine screening mammography on 12/02/2019 showing a possible abnormality in the bilateral breasts. She underwent bilateral diagnostic mammography with tomography and bilateral breast ultrasonography at [facility] on 12/14/2019 showing: breast density category B; 3 mm irregular mass in right breast at 9:30; cysts in left breast at 12 o'clock on ultrasound have irregular margins on mammogram; benign-appearing lymph nodes in right axilla.  Accordingly on 01/06/2020 she proceeded to biopsy of the bilateral breast areas in question. The pathology from this procedure ([id number]) showed:  1. 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 Proliferation marker Ki67 at 1%. HER2 negative by immunohistochemistry (1+). 2. Right Breast  - invasive ductal carcinoma, grade 1  - ductal carcinoma in situ  - Prognostic indicators significant for: estrogen receptor, 80% positive and progesterone receptor, 10% positive, both with strong staining intensity. Proliferation marker Ki67 at 1%. HER2 negative by immunohistochemistry (1+).  She underwent bilateral lumpectomies on 02/11/2020 under Dr. Ninfa Linden. Pathology from the procedure [id number]) showed: 1. Left Breast  - ductal carcinoma in situ, intermediate grade  - columnar cell and fibrocystic changes with apocrine metaplasia  - focal atypical ductal hyperplasia  - lobular neoplasia   - no residual carcinoma 2. Right Breast  - ductal carcinoma in situ, intermediate grade  -  adenosis, columnar cell, and fibrocystic changes  - no residual carcinoma  She subsequently received  radiation therapy from 04/13/2020 through 05/13/2020 under Dr. Lisbeth Renshaw.  Cancer Staging Malignant neoplasm of upper-outer quadrant of left breast in female, estrogen receptor positive (Sheila Nguyen) Staging form: Breast, AJCC 8th Edition - Clinical stage from 01/13/2020: Stage IA (cT1b, cN0, cM0, G1, ER+, PR-, HER2-) - Signed by Chauncey Cruel, MD on 06/02/2020 Stage prefix: Initial diagnosis Method of lymph node assessment: Clinical Histologic grading system: 3 grade system  Malignant neoplasm of upper-outer quadrant of right breast in female, estrogen receptor positive (Sheila Nguyen) Staging form: Breast, AJCC 8th Edition - Clinical stage from 01/13/2020: Stage IA (cT1a, cN0, cM0, G1, ER+, PR+, HER2-) - Unsigned Stage prefix: Initial diagnosis Method of lymph node assessment: Clinical Histologic grading system: 3 grade system  The patient's subsequent history is as detailed below.   PAST MEDICAL HISTORY: Past Medical History:  Diagnosis Date  . Anxiety   . Cancer ([place]) 12/2019   Bilateral IDC with DCIS  . Depression   . Hyperlipidemia   . Hypertension   . Urinary incontinence    With coughing and sneezing    PAST SURGICAL HISTORY: Past Surgical History:  Procedure Laterality Date  . 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 BREAST LUMPECTOMY WITH RADIOACTIVE SEED LOCALIZATION Bilateral 02/11/2020   Procedure: BILATERAL BREAST LUMPECTOMY WITH RADIOACTIVE SEED LOCALIZATIONS;  Surgeon: Coralie Keens, MD;  Location: [facility];  Service: General;  Laterality: Bilateral;  . CESAREAN SECTION  1979    FAMILY HISTORY: Family History  Problem Relation Age of Onset  . Alcohol abuse Father   . Stroke Father   . Colon cancer Neg Hx   . Breast cancer Neg Hx   The patient's father died at the age of 55 from a stroke.  The patient's mother died at age 33.  The patient has 1 brother, no sisters.  She is not aware of any cancer history in the family   GYNECOLOGIC HISTORY:  No LMP  recorded. Patient is postmenopausal. Menarche: 75 years old Age at first live birth: 75 years old GX P 1 (+1 adopted child). LMP in her late 53's Contraceptive: used for 3 years HRT yes  Hysterectomy? no BSO? no   SOCIAL HISTORY: (updated January 2022 Sheila Nguyen is originally from [place].  She moved to [place] approximately age 74.  She worked as a Oceanographer.  Her husband died from urothelial cancer in the setting of tobacco abuse.  She lives by herself.  Patient is adopted daughter Sheila Nguyen is a Animal nutritionist in [place], currently 77 years old.  The patient's son Sheila Nguyen works for [employer] in [place].  He is 77 years old.  The patient has no grandchildren.  She is a Tourist information centre manager    ADVANCED DIRECTIVES:    HEALTH MAINTENANCE: Social History   Tobacco Use  . Smoking status: Never Smoker  . Smokeless tobacco: Never Used  Substance Use Topics  . Alcohol use: No    Alcohol/week: 0.0 standard drinks  . Drug use: No     Colonoscopy: Refuses  PAP: Remote  Bone density: 09/2017   Allergies  Allergen Reactions  . Other     Almond, lemon, chocolate, barley  . Amoxicillin Rash    Flushed face/fevery  . Wellbutrin [Bupropion] Other (See Comments)    Max tolerated dose is 135m daily.  Irritable on higher dose.     Current Outpatient Medications  Medication Sig Dispense Refill  .  ALPRAZolam (XANAX) 0.25 MG tablet TAKE 1 TABLET BY MOUTH DAILY AS NEEDED FOR ANXIETY( FOR FLIGHTS IF NEEDED). MAY CAUSE DROWSINESS. 10 tablet 1  . anastrozole (ARIMIDEX) 1 MG tablet Take 1 tablet (1 mg total) by mouth daily. Start 06/07/2020 90 tablet 4  . aspirin EC 81 MG tablet Take 81 mg by mouth daily. Swallow whole.    Marland Kitchen buPROPion (WELLBUTRIN SR) 100 MG 12 hr tablet Take 1 tablet (100 mg total) by mouth daily. 90 tablet 3  . Cholecalciferol (VITAMIN D) 50 MCG (2000 UT) tablet Take 2,000 Units by mouth daily.    . citalopram (CELEXA) 10 MG tablet Take 1 tablet (10 mg total) by mouth daily. 90 tablet  3  . FIBER FORMULA PO Take 1 capsule by mouth 2 (two) times daily.    Marland Kitchen olmesartan (BENICAR) 5 MG tablet Take 1-1.5 tablets (5-7.5 mg total) by mouth daily.    . pravastatin (PRAVACHOL) 40 MG tablet 1 tab by mouth at night. 90 tablet 3   No current facility-administered medications for this visit.    OBJECTIVE: White woman who appears younger than stated age  There were no vitals filed for this visit.   There is no height or weight on file to calculate BMI.   Wt Readings from Last 3 Encounters:  06/16/20 163 lb (73.9 kg)  06/02/20 167 lb 3.2 oz (75.8 kg)  03/15/20 166 lb 9.6 oz (75.6 kg)      ECOG FS:1 - Symptomatic but completely ambulatory  Telemedicine visit 08/22/2020  LAB RESULTS:  CMP     Component Value Date/Time   NA 140 01/13/2020 1218   K 4.0 01/13/2020 1218   CL 106 01/13/2020 1218   CO2 25 01/13/2020 1218   GLUCOSE 147 (H) 01/13/2020 1218   BUN 12 01/13/2020 1218   CREATININE 0.90 01/13/2020 1218   CALCIUM 9.5 01/13/2020 1218   PROT 7.4 01/13/2020 1218   ALBUMIN 3.9 01/13/2020 1218   AST 17 01/13/2020 1218   ALT 25 01/13/2020 1218   ALKPHOS 119 01/13/2020 1218   BILITOT 0.4 01/13/2020 1218   GFRNONAA >60 01/13/2020 1218   GFRAA >60 01/13/2020 1218    No results found for: TOTALPROTELP, ALBUMINELP, A1GS, A2GS, BETS, BETA2SER, GAMS, MSPIKE, SPEI  Lab Results  Component Value Date   WBC 7.2 01/13/2020   NEUTROABS 4.4 01/13/2020   HGB 14.9 01/13/2020   HCT 45.9 01/13/2020   MCV 91.4 01/13/2020   PLT 248 01/13/2020    No results found for: LABCA2  No components found for: [id number]  No results for input(s): INR in the last 168 hours.  No results found for: LABCA2  No results found for: DDU202  No results found for: RKY706  No results found for: CBJ628  No results found for: CA2729  No components found for: HGQUANT  No results found for: CEA1 / No results found for: CEA1   No results found for: AFPTUMOR  No results found for:  CHROMOGRNA  No results found for: KPAFRELGTCHN, LAMBDASER, KAPLAMBRATIO (kappa/lambda light chains)  No results found for: HGBA, HGBA2QUANT, HGBFQUANT, HGBSQUAN (Hemoglobinopathy evaluation)   No results found for: LDH  No results found for: IRON, TIBC, IRONPCTSAT (Iron and TIBC)  No results found for: FERRITIN  Urinalysis    Component Value Date/Time   COLORURINE YELLOW 05/18/2013 [place] 05/18/2013 1037   LABSPEC 1.006 05/18/2013 1037   PHURINE 7.0 05/18/2013 1037   GLUCOSEU NEG 05/18/2013 1037   HGBUR NEG 05/18/2013  [place] 05/18/2013 1037   KETONESUR NEG 05/18/2013 1037   PROTEINUR NEG 05/18/2013 1037   UROBILINOGEN 0.2 05/18/2013 1037   NITRITE NEG 05/18/2013 1037   LEUKOCYTESUR MOD (A) 05/18/2013 1037     STUDIES: No results found.   ELIGIBLE FOR AVAILABLE RESEARCH PROTOCOL: no  ASSESSMENT: 75 y.o. [place] woman status post bilateral breast biopsies 01/06/2020, showing  (1) on the right, an invasive ductal carcinoma, grade 1, measuring 0.4 cm (T1a), estrogen and progesterone receptor positive, with an MIB-1 of 1% and HER-2 not amplified  (2 on the left, and invasive ductal carcinoma, grade 1, measuring 1.0 cm. (T1b), estrogen receptor positive, progesterone receptor negative, with an MIB-1 of 1%.  HER-2 was not amplified  (3) status post bilateral lumpectomies 02/11/2020 showing bilateral ductal carcinoma in situ, both grade 2, with no evidence of invasive disease, and negative margins  (4) adjuvant radiation 04/13/20 - 05/13/20 Site/dose:   The patient initially received a dose of 42.56 Gy in 16 fractions to the bilateral breasts using whole-breast tangent fields. This was delivered using a 3-D conformal technique. The patient then received a boost to each seroma. This delivered an additional 8 Gy in 4 fractions using a 3 field photon technique on each side due to the depth of the seroma. The total dose to each breast was 50.56  Gy.  (5) anastrozole started 06/02/2020  (a) bone density at [facility] 01/27/2020 showed a T score of -1.5   PLAN: Sheila Nguyen is tolerating anastrozole well and the plan will be to continue that a total of 5 years.  We discussed gabapentin.  She however does not have significant nighttime hot flashes.  We talked about venlafaxine but she is already on 2 antidepressants.  We then discussed a TTS 1 patch which I think would be very reasonable for her to try.  However at this point she prefers to not add any further medications and see if for the little bit more time to hot flashes improved on their own  She will have her mammography in early August and she will see me again in September.  Sheila Nguyen. Eljay Lave, MD 08/22/2020 1:59 PM Medical Oncology and Hematology [facility] [place], [place] [address] Tel. [phone]    Fax. [phone]   This document serves as a record of services personally performed by Lurline Del, MD. It was created on his behalf by Wilburn Mylar, a trained medical scribe. The creation of this record is based on the scribe's personal observations and the provider's statements to them.   I, Lurline Del MD, have reviewed the above documentation for accuracy and completeness, and I agree with the above.   *Total Encounter Time as defined by the Centers for Medicare and Medicaid Services includes, in addition to the face-to-face time of a patient visit (documented in the note above) non-face-to-face time: obtaining and reviewing outside history, ordering and reviewing medications, tests or procedures, care coordination (communications with other health care professionals or caregivers) and documentation in the medical record.

## 2020-08-22 ENCOUNTER — Inpatient Hospital Stay: Payer: Medicare Other | Attending: Oncology | Admitting: Oncology

## 2020-08-22 DIAGNOSIS — C50411 Malignant neoplasm of upper-outer quadrant of right female breast: Secondary | ICD-10-CM | POA: Diagnosis not present

## 2020-08-22 DIAGNOSIS — C50412 Malignant neoplasm of upper-outer quadrant of left female breast: Secondary | ICD-10-CM | POA: Diagnosis not present

## 2020-08-22 DIAGNOSIS — Z170 Estrogen receptor positive status [ER+]: Secondary | ICD-10-CM

## 2020-08-30 ENCOUNTER — Telehealth: Payer: Self-pay | Admitting: Oncology

## 2020-08-30 NOTE — Telephone Encounter (Signed)
R/s per 1/27 sch mes los on pt request

## 2020-09-14 ENCOUNTER — Other Ambulatory Visit: Payer: Self-pay | Admitting: Family Medicine

## 2020-09-14 NOTE — Telephone Encounter (Signed)
Last office visit 06/16/20 Last CPE 10/06/19 Last refill on all Pravastatin, Bupropion, citalopram  10/06/19 #90/3 No upcoming appointment scheduled See contraindiction

## 2020-09-15 NOTE — Telephone Encounter (Signed)
Okay to continue.  Please schedule yearly visit for this fall.  Thanks.

## 2020-09-20 DIAGNOSIS — C50911 Malignant neoplasm of unspecified site of right female breast: Secondary | ICD-10-CM | POA: Diagnosis not present

## 2020-09-20 DIAGNOSIS — C50912 Malignant neoplasm of unspecified site of left female breast: Secondary | ICD-10-CM | POA: Diagnosis not present

## 2020-09-20 NOTE — Telephone Encounter (Signed)
Scheduled patient for 11/04/20 at 2 pm

## 2020-10-25 ENCOUNTER — Encounter: Payer: Self-pay | Admitting: Family Medicine

## 2020-10-31 ENCOUNTER — Other Ambulatory Visit: Payer: Self-pay | Admitting: Family Medicine

## 2020-10-31 DIAGNOSIS — M8580 Other specified disorders of bone density and structure, unspecified site: Secondary | ICD-10-CM

## 2020-10-31 DIAGNOSIS — I10 Essential (primary) hypertension: Secondary | ICD-10-CM

## 2020-11-01 DIAGNOSIS — Z853 Personal history of malignant neoplasm of breast: Secondary | ICD-10-CM | POA: Diagnosis not present

## 2020-11-01 LAB — HM MAMMOGRAPHY

## 2020-11-02 ENCOUNTER — Other Ambulatory Visit (INDEPENDENT_AMBULATORY_CARE_PROVIDER_SITE_OTHER): Payer: Medicare Other

## 2020-11-02 DIAGNOSIS — M8580 Other specified disorders of bone density and structure, unspecified site: Secondary | ICD-10-CM

## 2020-11-02 DIAGNOSIS — I10 Essential (primary) hypertension: Secondary | ICD-10-CM

## 2020-11-02 LAB — LIPID PANEL
Cholesterol: 138 mg/dL (ref 0–200)
HDL: 44.4 mg/dL (ref >39.00)
LDL Cholesterol: 70 mg/dL (ref 0–99)
NonHDL: 93.82
Total CHOL/HDL Ratio: 3
Triglycerides: 119 mg/dL (ref 0.0–149.0)
VLDL: 23.8 mg/dL (ref 0.0–40.0)

## 2020-11-02 LAB — COMPREHENSIVE METABOLIC PANEL
ALT: 21 U/L (ref 0–35)
AST: 16 U/L (ref 0–37)
Albumin: 4.3 g/dL (ref 3.5–5.2)
Alkaline Phosphatase: 127 U/L — ABNORMAL HIGH (ref 39–117)
BUN: 13 mg/dL (ref 6–23)
CO2: 26 mEq/L (ref 19–32)
Calcium: 9.6 mg/dL (ref 8.4–10.5)
Chloride: 105 mEq/L (ref 96–112)
Creatinine, Ser: 0.86 mg/dL (ref 0.40–1.20)
GFR: 65.22 mL/min (ref >60.00)
Glucose, Bld: 99 mg/dL (ref 70–99)
Potassium: 4 mEq/L (ref 3.5–5.1)
Sodium: 140 mEq/L (ref 135–145)
Total Bilirubin: 0.5 mg/dL (ref 0.2–1.2)
Total Protein: 7.3 g/dL (ref 6.0–8.3)

## 2020-11-02 LAB — CBC WITH DIFFERENTIAL/PLATELET
Basophils Absolute: 0 10*3/uL (ref 0.0–0.1)
Basophils Relative: 0.6 % (ref 0.0–3.0)
Eosinophils Absolute: 0.1 10*3/uL (ref 0.0–0.7)
Eosinophils Relative: 1.3 % (ref 0.0–5.0)
HCT: 43.3 % (ref 36.0–46.0)
Hemoglobin: 14.7 g/dL (ref 12.0–15.0)
Lymphocytes Relative: 30.1 % (ref 12.0–46.0)
Lymphs Abs: 1.7 10*3/uL (ref 0.7–4.0)
MCHC: 34 g/dL (ref 30.0–36.0)
MCV: 89.6 fl (ref 78.0–100.0)
Monocytes Absolute: 0.7 10*3/uL (ref 0.1–1.0)
Monocytes Relative: 11.9 % (ref 3.0–12.0)
Neutro Abs: 3.1 10*3/uL (ref 1.4–7.7)
Neutrophils Relative %: 56.1 % (ref 43.0–77.0)
Platelets: 237 10*3/uL (ref 150.0–400.0)
RBC: 4.83 Mil/uL (ref 3.87–5.11)
RDW: 13.3 % (ref 11.5–15.5)
WBC: 5.6 10*3/uL (ref 4.0–10.5)

## 2020-11-02 LAB — VITAMIN D 25 HYDROXY (VIT D DEFICIENCY, FRACTURES): VITD: 49.26 ng/mL (ref 30.00–100.00)

## 2020-11-04 ENCOUNTER — Other Ambulatory Visit: Payer: Self-pay

## 2020-11-04 ENCOUNTER — Ambulatory Visit (INDEPENDENT_AMBULATORY_CARE_PROVIDER_SITE_OTHER): Payer: Medicare Other | Admitting: Family Medicine

## 2020-11-04 ENCOUNTER — Encounter: Payer: Self-pay | Admitting: Family Medicine

## 2020-11-04 VITALS — BP 138/78 | HR 89 | Temp 97.2°F | Ht 63.0 in | Wt 166.0 lb

## 2020-11-04 DIAGNOSIS — F32A Depression, unspecified: Secondary | ICD-10-CM | POA: Diagnosis not present

## 2020-11-04 DIAGNOSIS — Z0000 Encounter for general adult medical examination without abnormal findings: Secondary | ICD-10-CM | POA: Diagnosis not present

## 2020-11-04 DIAGNOSIS — E7800 Pure hypercholesterolemia, unspecified: Secondary | ICD-10-CM

## 2020-11-04 DIAGNOSIS — I10 Essential (primary) hypertension: Secondary | ICD-10-CM | POA: Diagnosis not present

## 2020-11-04 DIAGNOSIS — F419 Anxiety disorder, unspecified: Secondary | ICD-10-CM

## 2020-11-04 DIAGNOSIS — Z7189 Other specified counseling: Secondary | ICD-10-CM

## 2020-11-04 DIAGNOSIS — R748 Abnormal levels of other serum enzymes: Secondary | ICD-10-CM | POA: Diagnosis not present

## 2020-11-04 MED ORDER — OLMESARTAN MEDOXOMIL 5 MG PO TABS
5.0000 mg | ORAL_TABLET | Freq: Every day | ORAL | Status: DC
Start: 2020-11-04 — End: 2020-11-04

## 2020-11-04 MED ORDER — OLMESARTAN MEDOXOMIL 5 MG PO TABS
5.0000 mg | ORAL_TABLET | Freq: Every day | ORAL | 3 refills | Status: DC
Start: 2020-11-04 — End: 2021-12-07

## 2020-11-04 MED ORDER — ALPRAZOLAM 0.25 MG PO TABS
ORAL_TABLET | ORAL | 1 refills | Status: DC
Start: 2020-11-04 — End: 2022-01-24

## 2020-11-04 NOTE — Patient Instructions (Signed)
Don't change your meds for now.  Let me check with Dr. Jana Hakim.  Take care.  Glad to see you.

## 2020-11-04 NOTE — Progress Notes (Signed)
This visit occurred during the SARS-CoV-2 public health emergency.  Safety protocols were in place, including screening questions prior to the visit, additional usage of staff PPE, and extensive cleaning of exam room while observing appropriate contact time as indicated for disinfecting solutions.  I have personally reviewed the Medicare Annual Wellness questionnaire and have noted 1. The patient's medical and social history 2. Their use of alcohol, tobacco or illicit drugs 3. Their current medications and supplements 4. The patient's functional ability including ADL's, fall risks, home safety risks and hearing or visual             impairment. 5. Diet and physical activities 6. Evidence for depression or mood disorders  The patients weight, height, BMI have been recorded in the chart and visual acuity is per eye clinic.  I have made referrals, counseling and provided education to the patient based review of the above and I have provided the pt with a written personalized care plan for preventive services.  Provider list updated- see scanned forms.  Routine anticipatory guidance given to patient.  See health maintenance. The possibility exists that previously documented standard health maintenance information may have been brought forward from a previous encounter into this note.  If needed, that same information has been updated to reflect the current situation based on today's encounter.    Flu 2021 Shingles up-to-date PNA previously done Tetanus up-to-date COVID-vaccine previously done. Colon cancer screening declined by patient.  Discussed. Breast cancer screening recently done 2022 Bone density test 2021 Advance directive-son designated patient were incapacitated Cognitive function addressed- see scanned forms- and if abnormal then additional documentation follows.   In addition to [facility] Wellness, follow up visit for the below conditions:  Mood d/w pt.  Mood is good considering  her situation and she didn't want to change her rx at this point.  She has social support.    Oncology f/u pending.   Alk phos elevation d/w pt.  H/o similar.  She does not have any abdominal symptoms.  She is at her baseline functional status.  Hypertension:    Using medication without problems or lightheadedness: yes Chest pain with exertion:no Edema:no Short of breath:no  Elevated Cholesterol: Using medications without problems:yes Muscle aches: no Diet compliance:yes Exercise:yes  She has had no more episodes of syncope in the meantime.  Her previous episode was after vaccination.  She is feeling well otherwise without recurrent symptoms now.  PMH and SH reviewed  Meds, vitals, and allergies reviewed.   ROS: Per HPI.  Unless specifically indicated otherwise in HPI, the patient denies:  General: fever. Eyes: acute vision changes ENT: sore throat Cardiovascular: chest pain Respiratory: SOB GI: vomiting GU: dysuria Musculoskeletal: acute back pain Derm: acute rash Neuro: acute motor dysfunction Psych: worsening mood Endocrine: polydipsia Heme: bleeding Allergy: hayfever  GEN: nad, alert and oriented HEENT: ncat NECK: supple w/o LA CV: rrr. PULM: ctab, no inc wob ABD: soft, +bs EXT: no edema SKIN: no acute rash

## 2020-11-09 ENCOUNTER — Telehealth: Payer: Self-pay | Admitting: Family Medicine

## 2020-11-09 DIAGNOSIS — R748 Abnormal levels of other serum enzymes: Secondary | ICD-10-CM | POA: Insufficient documentation

## 2020-11-09 NOTE — Assessment & Plan Note (Signed)
Mood d/w pt.  Mood is good considering her situation and she didn't want to change her rx at this point.  She has social support.  Continue Wellbutrin and citalopram at baseline.

## 2020-11-09 NOTE — Assessment & Plan Note (Signed)
- 

## 2020-11-09 NOTE — Assessment & Plan Note (Signed)
Continue pravastatin 

## 2020-11-09 NOTE — Assessment & Plan Note (Signed)
Alk phos elevation d/w pt.  H/o similar.  She does not have any abdominal symptoms.  She is at her baseline functional status.  I suspect this is benign and insignificant mild elevation but I will ask for input from oncology.

## 2020-11-09 NOTE — Assessment & Plan Note (Signed)
Flu 2021 Shingles up-to-date PNA previously done Tetanus up-to-date COVID-vaccine previously done. Colon cancer screening declined by patient.  Discussed. Breast cancer screening recently done 2022 Bone density test 2021 Advance directive-son designated patient were incapacitated Cognitive function addressed- see scanned forms- and if abnormal then additional documentation follows.

## 2020-11-09 NOTE — Assessment & Plan Note (Signed)
Would continue as needed benzodiazepine for plane flights.  Discussed.

## 2020-11-09 NOTE — Telephone Encounter (Signed)
Please see my office visit note.  She has a mild increase in her alkaline phosphatase.  She had a history of similar in the past that had subsequently normalized without intervention.  She is going to see you soon.  I thought at most we could recheck her alkaline phosphatase but I would like your input.  I have not yet put in the follow-up orders.  Thank you.

## 2020-11-09 NOTE — Assessment & Plan Note (Signed)
Advance directive-son designated patient were incapacitated

## 2020-11-10 ENCOUNTER — Other Ambulatory Visit: Payer: Self-pay | Admitting: Oncology

## 2020-11-10 DIAGNOSIS — C50411 Malignant neoplasm of upper-outer quadrant of right female breast: Secondary | ICD-10-CM

## 2020-11-11 NOTE — Telephone Encounter (Signed)
I appreciate input from Dr. Jana Hakim.  Please update patient that he is going to recheck the lab at the visit at his clinic.  Thanks.

## 2020-11-11 NOTE — Telephone Encounter (Signed)
Patient aware labs will be rechecked at visit on 11/14/20 with Dr. Jana Hakim.

## 2020-11-14 ENCOUNTER — Inpatient Hospital Stay: Payer: Medicare Other | Attending: Oncology

## 2020-11-14 ENCOUNTER — Other Ambulatory Visit: Payer: Self-pay

## 2020-11-14 ENCOUNTER — Inpatient Hospital Stay: Payer: Medicare Other | Admitting: Oncology

## 2020-11-14 VITALS — BP 171/69 | HR 95 | Temp 97.7°F | Resp 20 | Ht 63.0 in | Wt 165.5 lb

## 2020-11-14 DIAGNOSIS — C50412 Malignant neoplasm of upper-outer quadrant of left female breast: Secondary | ICD-10-CM | POA: Diagnosis not present

## 2020-11-14 DIAGNOSIS — Z79811 Long term (current) use of aromatase inhibitors: Secondary | ICD-10-CM | POA: Insufficient documentation

## 2020-11-14 DIAGNOSIS — E785 Hyperlipidemia, unspecified: Secondary | ICD-10-CM | POA: Insufficient documentation

## 2020-11-14 DIAGNOSIS — Z923 Personal history of irradiation: Secondary | ICD-10-CM | POA: Insufficient documentation

## 2020-11-14 DIAGNOSIS — C50411 Malignant neoplasm of upper-outer quadrant of right female breast: Secondary | ICD-10-CM

## 2020-11-14 DIAGNOSIS — I10 Essential (primary) hypertension: Secondary | ICD-10-CM | POA: Diagnosis not present

## 2020-11-14 DIAGNOSIS — Z170 Estrogen receptor positive status [ER+]: Secondary | ICD-10-CM | POA: Diagnosis not present

## 2020-11-14 DIAGNOSIS — R232 Flushing: Secondary | ICD-10-CM | POA: Diagnosis not present

## 2020-11-14 DIAGNOSIS — Z79899 Other long term (current) drug therapy: Secondary | ICD-10-CM | POA: Insufficient documentation

## 2020-11-14 DIAGNOSIS — Z7982 Long term (current) use of aspirin: Secondary | ICD-10-CM | POA: Insufficient documentation

## 2020-11-14 DIAGNOSIS — D0512 Intraductal carcinoma in situ of left breast: Secondary | ICD-10-CM | POA: Insufficient documentation

## 2020-11-14 LAB — HEPATIC FUNCTION PANEL
ALT: 26 U/L (ref 0–44)
AST: 21 U/L (ref 15–41)
Albumin: 4.2 g/dL (ref 3.5–5.0)
Alkaline Phosphatase: 130 U/L — ABNORMAL HIGH (ref 38–126)
Bilirubin, Direct: 0.1 mg/dL (ref 0.0–0.2)
Indirect Bilirubin: 0.4 mg/dL (ref 0.3–0.9)
Total Bilirubin: 0.5 mg/dL (ref 0.3–1.2)
Total Protein: 7.1 g/dL (ref 6.5–8.1)

## 2020-11-14 MED ORDER — ANASTROZOLE 1 MG PO TABS
1.0000 mg | ORAL_TABLET | Freq: Every day | ORAL | 4 refills | Status: DC
Start: 2020-11-14 — End: 2021-11-22

## 2020-11-14 NOTE — Progress Notes (Signed)
[facility]  [phone] [phone]     ID: Sheila Nguyen DOB: July 06, 1945  MR#: [id number]  NUU#:[id number]  Patient Care Team: Sheila Ghent, MD as PCP - General (Family Medicine) Sheila Kaufmann, RN as Oncology Nurse Navigator Sheila Germany, RN as Oncology Nurse Navigator Sheila Keens, MD as Consulting Physician (General Surgery) Sheila Merle, MD as Consulting Physician (Hematology) Sheila Rudd, MD as Consulting Physician (Radiation Oncology) Sheila Nguyen, Sheila Dad, MD as Consulting Physician (Oncology) Sheila Cruel, MD OTHER MD:   CHIEF COMPLAINT: Estrogen receptor positive breast cancer  CURRENT TREATMENT: Anastrozole   INTERVAL HISTORY: Sheila Nguyen returns today for follow up of her estrogen receptor positive breast cancer.  She continues on anastrozole, started 06/02/2020. She does have hot flashes especially in the morning.  There were some days than others.  They do not wake her up at night.  Vaginal dryness is not an issue.  She has not experienced any arthralgias or myalgias.  She will be due for annual mammography at the end of this month.   REVIEW OF SYSTEMS: Sheila Nguyen exercises by walking perhaps a mile perhaps 2-3 times a week but she is currently deterred by the heat.  Otherwise her only concern is the elevation in the alkaline phosphatase.  Note that she denies any bony aches or pains, does not have any alcohol, and is not on any supplements other than aspirin and vitamin D.  A detailed review of systems today was otherwise stable   COVID 19 VACCINATION STATUS: Moderna x2, with booster September 2021   HISTORY OF CURRENT ILLNESS: From a prior intake note:  Sheila Nguyen had routine screening mammography on 12/02/2019 showing a possible abnormality in the bilateral breasts. She underwent bilateral diagnostic mammography with tomography and bilateral breast ultrasonography at [facility] on 12/14/2019 showing: breast density category  B; 3 mm irregular mass in right breast at 9:30; cysts in left breast at 12 o'clock on ultrasound have irregular margins on mammogram; benign-appearing lymph nodes in right axilla.  Accordingly on 01/06/2020 she proceeded to biopsy of the bilateral breast areas in question. The pathology from this procedure ([id number]) showed:  1. 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 Proliferation marker Ki67 at 1%. HER2 negative by immunohistochemistry (1+). 2. Right Breast  - invasive ductal carcinoma, grade 1  - ductal carcinoma in situ  - Prognostic indicators significant for: estrogen receptor, 80% positive and progesterone receptor, 10% positive, both with strong staining intensity. Proliferation marker Ki67 at 1%. HER2 negative by immunohistochemistry (1+).  She underwent bilateral lumpectomies on 02/11/2020 under Dr. Ninfa Nguyen. Pathology from the procedure [id number]) showed: 1. Left Breast  - ductal carcinoma in situ, intermediate grade  - columnar cell and fibrocystic changes with apocrine metaplasia  - focal atypical ductal hyperplasia  - lobular neoplasia   - no residual carcinoma 2. Right Breast  - ductal carcinoma in situ, intermediate grade  - adenosis, columnar cell, and fibrocystic changes  - no residual carcinoma  She subsequently received radiation therapy from 04/13/2020 through 05/13/2020 under Dr. Lisbeth Nguyen.  Cancer Staging Malignant neoplasm of upper-outer quadrant of left breast in female, estrogen receptor positive (Sheila Nguyen) Staging form: Breast, AJCC 8th Edition - Clinical stage from 01/13/2020: Stage IA (cT1b, cN0, cM0, G1, ER+, PR-, HER2-) - Signed by Sheila Cruel, MD on 06/02/2020 Stage prefix:  Initial diagnosis Method of lymph node assessment: Clinical Histologic grading system: 3 grade  system  Malignant neoplasm of upper-outer quadrant of right breast in female, estrogen receptor positive (Sheila Nguyen) Staging form: Breast, AJCC 8th Edition - Clinical stage from 01/13/2020: Stage IA (cT1a, cN0, cM0, G1, ER+, PR+, HER2-) - Unsigned Stage prefix: Initial diagnosis Method of lymph node assessment: Clinical Histologic grading system: 3 grade system  The patient's subsequent history is as detailed below.   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  The patient's mother died at age 20.  The patient has 1 brother, no sisters.  She is not aware of any cancer history in the family   GYNECOLOGIC HISTORY:  No LMP recorded. Patient is postmenopausal. Menarche: 75 years old Age at first live birth: 75 years old GX P 1 (+1 adopted child). LMP in her late 47's Contraceptive: used for 3 years HRT yes  Hysterectomy? no BSO? no   SOCIAL HISTORY: (updated January 2022 Sheila Nguyen is originally from [place].  She moved to [place] approximately age 7.  She worked as a Oceanographer.  Her husband died from urothelial cancer in the setting of tobacco abuse.  She lives by herself.   Patient's adopted daughter Sheila Nguyen is a Animal nutritionist in [place], [place].  The patient's son Aaron Nguyen works for [employer] in [place].   The patient has no grandchildren.  She is a Tourist information centre manager    ADVANCED DIRECTIVES:    HEALTH MAINTENANCE: Social History   Tobacco Use   Smoking status: Never   Smokeless tobacco: Never  Substance Use Topics   Alcohol use: No    Alcohol/week: 0.0 standard drinks   Drug use: No     Colonoscopy: Refuses  PAP: Remote  Bone density: 09/2017   Allergies  Allergen Reactions   Other     Almond, lemon, chocolate, barley   Amoxicillin Rash    Flushed face/fevery   Wellbutrin [Bupropion] Other (See Comments)    Max tolerated dose is $Remov'100mg'ldFUEL$  daily.  Irritable on higher dose.     Current Outpatient Medications  Medication Sig Dispense Refill   diphenhydrAMINE (BENADRYL ALLERGY) 25 mg capsule Take 1 capsule (25 mg total) by mouth every 6 (six) hours as needed. 30 capsule 0   ALPRAZolam (XANAX) 0.25 MG tablet TAKE 1 TABLET BY MOUTH DAILY AS NEEDED FOR ANXIETY( FOR FLIGHTS IF NEEDED). MAY CAUSE DROWSINESS. 10 tablet 1   anastrozole (ARIMIDEX) 1 MG tablet Take 1 tablet (1 mg total) by mouth daily. Start 06/07/2020 90 tablet 4   aspirin EC 81 MG tablet Take 81 mg by mouth  daily. Swallow whole.     buPROPion (WELLBUTRIN SR) 100 MG 12 hr tablet TAKE 1 TABLET(100 MG) BY MOUTH DAILY 90 tablet 3   Cholecalciferol (VITAMIN D) 50 MCG (2000 UT) tablet Take 2,000 Units by mouth daily.     citalopram (CELEXA) 10 MG tablet TAKE 1 TABLET(10 MG) BY MOUTH DAILY 90 tablet 3   FIBER FORMULA PO Take 1 capsule by mouth 2 (two) times daily.     olmesartan (BENICAR) 5 MG tablet Take 1 tablet (5 mg total) by mouth daily. 90 tablet 3   pravastatin (PRAVACHOL) 40 MG tablet TAKE 1 TABLET BY MOUTH AT NIGHT 90 tablet 3   No current facility-administered medications for this visit.    OBJECTIVE: White woman who appears younger than stated age  Vitals:   11/14/20 1336  BP: (!) 171/69  Pulse: 95   Resp: 20  Temp: 97.7 F (36.5 C)  SpO2: 96%     Body mass index is 29.32 kg/m.   Wt Readings from Last 3 Encounters:  11/14/20 165 lb 8 oz (75.1 kg)  11/04/20 166 lb (75.3 kg)  06/16/20 163 lb (73.9 kg)     ECOG FS:1 - Symptomatic but completely ambulatory  Sclerae unicteric, EOMs intact Wearing a mask No cervical or supraclavicular adenopathy Lungs no rales or rhonchi Heart regular rate and rhythm Abd soft, nontender, positive bowel sounds MSK no focal spinal tenderness, no upper extremity lymphedema Neuro: nonfocal, well oriented, appropriate affect Breasts: Status post bilateral lumpectomies and bilateral radiation, no evidence of disease recurrence locally, with axillae are benign   LAB RESULTS:  CMP     Component Value Date/Time   NA 140 11/02/2020 0812   K 4.0 11/02/2020 0812   CL 105 11/02/2020 0812   CO2 26 11/02/2020 0812   GLUCOSE 99 11/02/2020 0812   BUN 13 11/02/2020 0812   CREATININE 0.86 11/02/2020 0812   CREATININE 0.90 01/13/2020 1218   CALCIUM 9.6 11/02/2020 0812   PROT 7.3 11/02/2020 0812   ALBUMIN 4.3 11/02/2020 0812   AST 16 11/02/2020 0812   AST 17 01/13/2020 1218   ALT 21 11/02/2020 0812   ALT 25 01/13/2020 1218   ALKPHOS 127 (H) 11/02/2020 0812   BILITOT 0.5 11/02/2020 0812   BILITOT 0.4 01/13/2020 1218   GFRNONAA >60 01/13/2020 1218   GFRAA >60 01/13/2020 1218    No results found for: TOTALPROTELP, ALBUMINELP, A1GS, A2GS, BETS, BETA2SER, GAMS, MSPIKE, SPEI  Lab Results  Component Value Date   WBC 5.6 11/02/2020   NEUTROABS 3.1 11/02/2020   HGB 14.7 11/02/2020   HCT 43.3 11/02/2020   MCV 89.6 11/02/2020   PLT 237.0 11/02/2020    No results found for: LABCA2  No components found for: [id number]  No results for input(s): INR in the last 168 hours.  No results found for: LABCA2  No results found for: YJZ649  No results found for: EPH903  No results found for: WRQ512  No results found for: CA2729  No components found  for: HGQUANT  No results found for: CEA1 / No results found for: CEA1   No results found for: AFPTUMOR  No results found for: CHROMOGRNA  No results found for: KPAFRELGTCHN, LAMBDASER, KAPLAMBRATIO (kappa/lambda light chains)  No results found for: HGBA, HGBA2QUANT, HGBFQUANT, HGBSQUAN (Hemoglobinopathy evaluation)   No results found for: LDH  No results found for: IRON, TIBC, IRONPCTSAT (Iron and TIBC)  No results found for: FERRITIN  Urinalysis    Component Value Date/Time  COLORURINE YELLOW 05/18/2013 [place] 05/18/2013 1037   LABSPEC 1.006 05/18/2013 1037   PHURINE 7.0 05/18/2013 1037   GLUCOSEU NEG 05/18/2013 1037   HGBUR NEG 05/18/2013 1037   BILIRUBINUR NEG 05/18/2013 1037   KETONESUR NEG 05/18/2013 1037   PROTEINUR NEG 05/18/2013 1037   UROBILINOGEN 0.2 05/18/2013 1037   NITRITE NEG 05/18/2013 1037   LEUKOCYTESUR MOD (A) 05/18/2013 1037     STUDIES: AL: Z85.3 - Personal History Of Breast Cancer Annual Mammogram. Personal history of bilateral breast cancer in 02/2020 with bilateral lumpectomies.  Digital breast tomosynthesis imaging has been obtained as part of this examination. Comparison is made to exam(s) dated: 12/02/2019, 12/14/2019. Breast Composition Category B: The breast tissue is composed of scattered fibroglandular density.  Current study was also evaluated with a Computer Aided Detection (CAD) system. Post surgical distortion and density at the superior left lumpectomy site are unchanged. New post surgical architectural distortion in the lateral right breast, posterior depth consistent with interval lumpectomy. No significant masses, calcifications, or other findings are seen in either breast.  IMPRESSION: BENIGN Stable left lumpectomy. New post treatment changes of the right breast. There is no mammographic evidence of malignancy. A follow-up mammogram in 12 months is recommended.(11/01/2021)   This exam was interpreted  at the [facility] location.  Sheila Nguyen M.D. sl/penrad:11/01/2020 10:50:44   Letter: Verbal- These findings were given to the patient verbally and in writing at the time of examination. BI-RADS Category 2: Benign   Electronically Signed By: Sheila Garter MD   ELIGIBLE FOR AVAILABLE RESEARCH PROTOCOL: no  ASSESSMENT: 75 y.o. [place] woman status post bilateral breast biopsies 01/06/2020, showing  (1) on the right, an invasive ductal carcinoma, grade 1, measuring 0.4 cm (T1a), estrogen and progesterone receptor positive, with an MIB-1 of 1% and HER-2 not amplified  (2 on the left, and invasive ductal carcinoma, grade 1, measuring 1.0 cm. (T1b), estrogen receptor positive, progesterone receptor negative, with an MIB-1 of 1%.  HER-2 was not amplified  (3) status post bilateral lumpectomies 02/11/2020 showing bilateral ductal carcinoma in situ, both grade 2, with no evidence of invasive disease, and negative margins  (4) adjuvant radiation 04/13/20 - 05/13/20  Site/dose:   The patient initially received a dose of 42.56 Gy in 16 fractions to the bilateral breasts using whole-breast tangent fields. This was delivered using a 3-D conformal technique. The patient then received a boost to each seroma. This delivered an additional 8 Gy in 4 fractions using a 3 field photon technique on each side due to the depth of the seroma. The total dose to each breast was 50.56 Gy.  (5) anastrozole started 06/02/2020  (a) bone density at [facility] 01/27/2020 showed a T score of -1.5   PLAN: Darothy is now close to a year out from definitive surgery for her breast cancer with no evidence of disease recurrence.  This is favorable.  She is tolerating anastrozole generally well.  She does have bothersome hot flashes particularly in the morning.  She is already on Wellbutrin and Celexa so I do not think we can add venlafaxine and in any case she says she does not want to take any more pills.  Likely the hot flashes do  not wake her up at night.  Otherwise we would start gabapentin.  We are repeating the alkaline phosphatase today just to make sure it is not a concern.  If it has not normalized will discuss with her but my vote would be to consider  repeating it yet 1 more time in 4 weeks rather than proceeding directly to scanograms  Otherwise she will see [place] again in 1 year.  She knows to call for any other issue that may develop before then  Total encounter time 25 minutes.Sarajane Jews C. Tilden Broz, MD 11/14/2020 2:03 PM Medical Oncology and Hematology [facility] [place], [place] [address] Tel. [phone]    Fax. [phone]   This document serves as a record of services personally performed by Lurline Del, MD. It was created on his behalf by Wilburn Mylar, a trained medical scribe. The creation of this record is based on the scribe's personal observations and the provider's statements to them.   I, Lurline Del MD, have reviewed the above documentation for accuracy and completeness, and I agree with the above.   *Total Encounter Time as defined by the Centers for Medicare and Medicaid Services includes, in addition to the face-to-face time of a patient visit (documented in the note above) non-face-to-face time: obtaining and reviewing outside history, ordering and reviewing medications, tests or procedures, care coordination (communications with other health care professionals or caregivers) and documentation in the medical record.

## 2020-11-15 ENCOUNTER — Other Ambulatory Visit: Payer: Self-pay | Admitting: Oncology

## 2020-11-15 NOTE — Progress Notes (Signed)
I called Sheila Nguyen and let her know that her alk phos is actually little bit higher.  We are fractionating that and we will proceed either to the liver or to bone studies depending on those results.

## 2020-11-16 ENCOUNTER — Other Ambulatory Visit: Payer: Self-pay | Admitting: Oncology

## 2020-11-16 DIAGNOSIS — Z170 Estrogen receptor positive status [ER+]: Secondary | ICD-10-CM

## 2020-11-16 DIAGNOSIS — C50412 Malignant neoplasm of upper-outer quadrant of left female breast: Secondary | ICD-10-CM

## 2020-11-16 DIAGNOSIS — R748 Abnormal levels of other serum enzymes: Secondary | ICD-10-CM

## 2020-11-16 LAB — ALKALINE PHOSPHATASE, ISOENZYMES
Alk Phos Bone Fract: 69 % — ABNORMAL HIGH (ref 14–68)
Alk Phos Liver Fract: 26 % (ref 18–85)
Alk Phos: 156 IU/L — ABNORMAL HIGH (ref 44–121)
Intestinal %: 5 % (ref 0–18)

## 2020-11-16 NOTE — Progress Notes (Signed)
I called [place] and let her know that the fractionated alkaline phosphatase is showing bone not liver.  My recommendation is we do a bone scan.  She was initially reluctant because it makes her so anxious to wait for results but I think she is also going to be anxious if we wait 2 months and repeat the labs.  After the discussion she is agreeable to proceed and I have entered the order for a bone scan.

## 2020-11-25 ENCOUNTER — Encounter (HOSPITAL_COMMUNITY)
Admission: RE | Admit: 2020-11-25 | Discharge: 2020-11-25 | Disposition: A | Discharge status: Home / Self Care | Payer: Medicare Other | Source: Ambulatory Visit | Attending: Oncology | Admitting: Oncology

## 2020-11-25 ENCOUNTER — Other Ambulatory Visit: Payer: Self-pay

## 2020-11-25 DIAGNOSIS — C50412 Malignant neoplasm of upper-outer quadrant of left female breast: Secondary | ICD-10-CM

## 2020-11-25 DIAGNOSIS — R748 Abnormal levels of other serum enzymes: Secondary | ICD-10-CM

## 2020-11-25 DIAGNOSIS — Z170 Estrogen receptor positive status [ER+]: Secondary | ICD-10-CM | POA: Insufficient documentation

## 2020-11-25 DIAGNOSIS — C50919 Malignant neoplasm of unspecified site of unspecified female breast: Secondary | ICD-10-CM | POA: Diagnosis not present

## 2020-11-25 MED ORDER — TECHNETIUM TC 99M MEDRONATE IV KIT
21.5000 | PACK | Freq: Once | INTRAVENOUS | Status: AC
  Administered 2020-11-25: 21.5 via INTRAVENOUS

## 2020-12-22 ENCOUNTER — Other Ambulatory Visit: Payer: Medicare Other

## 2020-12-22 ENCOUNTER — Ambulatory Visit: Payer: Medicare Other | Admitting: Oncology

## 2021-06-15 ENCOUNTER — Encounter (HOSPITAL_COMMUNITY): Payer: Self-pay

## 2021-06-28 ENCOUNTER — Encounter: Payer: Self-pay | Admitting: Family Medicine

## 2021-06-30 ENCOUNTER — Other Ambulatory Visit: Payer: Self-pay | Admitting: Family Medicine

## 2021-06-30 MED ORDER — CITALOPRAM HYDROBROMIDE 20 MG PO TABS: ORAL_TABLET | ORAL | 3 refills | Status: DC

## 2021-08-08 DIAGNOSIS — H2513 Age-related nuclear cataract, bilateral: Secondary | ICD-10-CM | POA: Diagnosis not present

## 2021-08-08 DIAGNOSIS — H353131 Nonexudative age-related macular degeneration, bilateral, early dry stage: Secondary | ICD-10-CM | POA: Diagnosis not present

## 2021-09-27 ENCOUNTER — Other Ambulatory Visit: Payer: Self-pay | Admitting: Family Medicine

## 2021-10-02 ENCOUNTER — Other Ambulatory Visit: Payer: Self-pay | Admitting: Family Medicine

## 2021-11-06 ENCOUNTER — Encounter: Payer: Self-pay | Admitting: Family Medicine

## 2021-11-13 ENCOUNTER — Other Ambulatory Visit: Payer: Self-pay | Admitting: Family Medicine

## 2021-11-13 DIAGNOSIS — M8580 Other specified disorders of bone density and structure, unspecified site: Secondary | ICD-10-CM

## 2021-11-13 DIAGNOSIS — I10 Essential (primary) hypertension: Secondary | ICD-10-CM

## 2021-11-13 DIAGNOSIS — E7800 Pure hypercholesterolemia, unspecified: Secondary | ICD-10-CM

## 2021-11-14 ENCOUNTER — Inpatient Hospital Stay: Payer: Medicare Other | Attending: Hematology and Oncology | Admitting: Hematology and Oncology

## 2021-11-14 ENCOUNTER — Other Ambulatory Visit: Payer: Self-pay | Admitting: *Deleted

## 2021-11-14 ENCOUNTER — Other Ambulatory Visit (INDEPENDENT_AMBULATORY_CARE_PROVIDER_SITE_OTHER): Payer: Medicare Other

## 2021-11-14 ENCOUNTER — Other Ambulatory Visit: Payer: Self-pay

## 2021-11-14 ENCOUNTER — Encounter: Payer: Self-pay | Admitting: Hematology and Oncology

## 2021-11-14 ENCOUNTER — Inpatient Hospital Stay: Payer: Medicare Other

## 2021-11-14 VITALS — BP 163/80 | HR 108 | Temp 98.1°F | Resp 18 | Ht 63.0 in | Wt 158.4 lb

## 2021-11-14 DIAGNOSIS — C50412 Malignant neoplasm of upper-outer quadrant of left female breast: Secondary | ICD-10-CM

## 2021-11-14 DIAGNOSIS — Z79899 Other long term (current) drug therapy: Secondary | ICD-10-CM | POA: Diagnosis not present

## 2021-11-14 DIAGNOSIS — N6489 Other specified disorders of breast: Secondary | ICD-10-CM | POA: Insufficient documentation

## 2021-11-14 DIAGNOSIS — I10 Essential (primary) hypertension: Secondary | ICD-10-CM

## 2021-11-14 DIAGNOSIS — E7800 Pure hypercholesterolemia, unspecified: Secondary | ICD-10-CM | POA: Diagnosis not present

## 2021-11-14 DIAGNOSIS — R232 Flushing: Secondary | ICD-10-CM | POA: Insufficient documentation

## 2021-11-14 DIAGNOSIS — Z79811 Long term (current) use of aromatase inhibitors: Secondary | ICD-10-CM | POA: Diagnosis not present

## 2021-11-14 DIAGNOSIS — C50911 Malignant neoplasm of unspecified site of right female breast: Secondary | ICD-10-CM | POA: Diagnosis not present

## 2021-11-14 DIAGNOSIS — C50912 Malignant neoplasm of unspecified site of left female breast: Secondary | ICD-10-CM | POA: Insufficient documentation

## 2021-11-14 DIAGNOSIS — Z170 Estrogen receptor positive status [ER+]: Secondary | ICD-10-CM

## 2021-11-14 DIAGNOSIS — M8580 Other specified disorders of bone density and structure, unspecified site: Secondary | ICD-10-CM | POA: Diagnosis not present

## 2021-11-14 LAB — CBC WITH DIFFERENTIAL/PLATELET
Basophils Absolute: 0 10*3/uL (ref 0.0–0.1)
Basophils Relative: 0.6 % (ref 0.0–3.0)
Eosinophils Absolute: 0.1 10*3/uL (ref 0.0–0.7)
Eosinophils Relative: 2.4 % (ref 0.0–5.0)
HCT: 42.8 % (ref 36.0–46.0)
Hemoglobin: 14.2 g/dL (ref 12.0–15.0)
Lymphocytes Relative: 33.8 % (ref 12.0–46.0)
Lymphs Abs: 1.6 10*3/uL (ref 0.7–4.0)
MCHC: 33.1 g/dL (ref 30.0–36.0)
MCV: 89.7 fl (ref 78.0–100.0)
Monocytes Absolute: 0.6 10*3/uL (ref 0.1–1.0)
Monocytes Relative: 12.7 % — ABNORMAL HIGH (ref 3.0–12.0)
Neutro Abs: 2.5 10*3/uL (ref 1.4–7.7)
Neutrophils Relative %: 50.5 % (ref 43.0–77.0)
Platelets: 213 10*3/uL (ref 150.0–400.0)
RBC: 4.77 Mil/uL (ref 3.87–5.11)
RDW: 13.8 % (ref 11.5–15.5)
WBC: 4.9 10*3/uL (ref 4.0–10.5)

## 2021-11-14 LAB — COMPREHENSIVE METABOLIC PANEL
ALT: 22 U/L (ref 0–35)
AST: 18 U/L (ref 0–37)
Albumin: 4.2 g/dL (ref 3.5–5.2)
Alkaline Phosphatase: 167 U/L — ABNORMAL HIGH (ref 39–117)
BUN: 17 mg/dL (ref 6–23)
CO2: 27 mEq/L (ref 19–32)
Calcium: 9.5 mg/dL (ref 8.4–10.5)
Chloride: 104 mEq/L (ref 96–112)
Creatinine, Ser: 0.91 mg/dL (ref 0.40–1.20)
GFR: 60.51 mL/min (ref >60.00)
Glucose, Bld: 97 mg/dL (ref 70–99)
Potassium: 3.8 mEq/L (ref 3.5–5.1)
Sodium: 139 mEq/L (ref 135–145)
Total Bilirubin: 0.6 mg/dL (ref 0.2–1.2)
Total Protein: 7.2 g/dL (ref 6.0–8.3)

## 2021-11-14 LAB — VITAMIN D 25 HYDROXY (VIT D DEFICIENCY, FRACTURES): VITD: 49.22 ng/mL (ref 30.00–100.00)

## 2021-11-14 LAB — LIPID PANEL
Cholesterol: 137 mg/dL (ref 0–200)
HDL: 42.9 mg/dL (ref >39.00)
LDL Cholesterol: 69 mg/dL (ref 0–99)
NonHDL: 94.3
Total CHOL/HDL Ratio: 3
Triglycerides: 128 mg/dL (ref 0.0–149.0)
VLDL: 25.6 mg/dL (ref 0.0–40.0)

## 2021-11-14 NOTE — Progress Notes (Signed)
[place]  [phone] [phone]     ID: Sheila Nguyen DOB: 02/24/1946  MR#: [id number]  LSL#:[id number]  Patient Care Team: Tonia Ghent, MD as PCP - General (Family Medicine) Mauro Kaufmann, RN as Oncology Nurse Navigator Rockwell Germany, RN as Oncology Nurse Navigator Coralie Keens, MD as Consulting Physician (General Surgery) Truitt Merle, MD as Consulting Physician (Hematology) Kyung Rudd, MD as Consulting Physician (Radiation Oncology) Magrinat, Virgie Dad, MD (Inactive) as Consulting Physician (Oncology) Benay Pike, MD OTHER MD:   CHIEF COMPLAINT: Estrogen receptor positive breast cancer  CURRENT TREATMENT: Anastrozole   INTERVAL HISTORY: Sheila Nguyen returns today for follow up of her estrogen receptor positive breast cancer. She started anastrozole Feb 2022, has some hot flashes, but they are not too bad now. She denies any major issues, tells met that she wakes up tired.  She also apparently snores but does not want to be evaluated for sleep apnea at this time. She is scheduled for mammogram tomorrow. She is very nervous when she comes for her doctors visits, she did not notice any breast changes since her last visit. She has not been walking as much. She has chronically elevated alkaline phosphatase which is currently managed by her PCP.  She apparently had a bone scan for evaluation of elevated alkaline phosphatase, no evidence of metastatic disease Rest of the pertinent 10 point ROS reviewed and negative   COVID 19 VACCINATION STATUS: Moderna x2, with booster September 2021   HISTORY OF CURRENT ILLNESS: From a prior intake note:  Sheila Nguyen had routine screening mammography on 12/02/2019 showing a possible abnormality in the bilateral breasts. She underwent bilateral diagnostic mammography with tomography and bilateral breast ultrasonography at [facility] on 12/14/2019 showing: breast density category B; 3 mm irregular mass  in right breast at 9:30; cysts in left breast at 12 o'clock on ultrasound have irregular margins on mammogram; benign-appearing lymph nodes in right axilla.  Accordingly on 01/06/2020 she proceeded to biopsy of the bilateral breast areas in question. The pathology from this procedure ([id number]) showed:  1. 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 Proliferation marker Ki67 at 1%. HER2 negative by immunohistochemistry (1+). 2. Right Breast  - invasive ductal carcinoma, grade 1  - ductal carcinoma in situ  - Prognostic indicators significant for: estrogen receptor, 80% positive and progesterone receptor, 10% positive, both with strong staining intensity. Proliferation marker Ki67 at 1%. HER2 negative by immunohistochemistry (1+).  She underwent bilateral lumpectomies on 02/11/2020 under Dr. Ninfa Linden. Pathology from the procedure [id number]) showed: 1. Left Breast  - ductal carcinoma in situ, intermediate grade  - columnar cell and fibrocystic changes with apocrine metaplasia  - focal atypical ductal hyperplasia  - lobular neoplasia   - no residual carcinoma 2. Right Breast  - ductal carcinoma in situ, intermediate grade  - adenosis, columnar cell, and fibrocystic changes  - no residual carcinoma  She subsequently received radiation therapy from 04/13/2020 through 05/13/2020 under Dr. Lisbeth Renshaw.   Cancer Staging  Malignant neoplasm of upper-outer quadrant of left breast in female, estrogen receptor positive ([place]) Staging form: Breast, AJCC 8th Edition - Clinical stage from 01/13/2020: Stage IA (cT1b, cN0, cM0, G1, ER+, PR-, HER2-) - Signed by Chauncey Cruel, MD on 06/02/2020 Stage prefix: Initial diagnosis Method of lymph node assessment: Clinical Histologic grading system: 3  grade system  Malignant  neoplasm of upper-outer quadrant of right breast in female, estrogen receptor positive ([place]) Staging form: Breast, AJCC 8th Edition - Clinical stage from 01/13/2020: Stage IA (cT1a, cN0, cM0, G1, ER+, PR+, HER2-) - Unsigned Stage prefix: Initial diagnosis Method of lymph node assessment: Clinical Histologic grading system: 3 grade system   The patient's subsequent history is as detailed below.   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  The patient's mother died at age 48.  The patient has 1 brother, no sisters.  She is not aware of any cancer history in the family   GYNECOLOGIC HISTORY:  No LMP recorded. Patient is postmenopausal. Menarche: 76 years old Age at first live birth: 76 years old GX P 1 (+1 adopted child). LMP in her late 41's Contraceptive: used for 3 years HRT yes  Hysterectomy? no BSO? no   SOCIAL HISTORY: (updated January 2022 Sheila Nguyen is originally from [place].  She moved to [place] approximately age 57.  She worked as a Oceanographer.  Her husband died from urothelial cancer in the setting of tobacco abuse.  She lives by herself.  Patient's adopted  daughter Sheila Nguyen is a Animal nutritionist in [place], [place].  The patient's son Sheila Nguyen works for [employer] in [place].   The patient has no grandchildren.  She is a Tourist information centre manager    ADVANCED DIRECTIVES:    HEALTH MAINTENANCE: Social History   Tobacco Use   Smoking status: Never   Smokeless tobacco: Never  Substance Use Topics   Alcohol use: No    Alcohol/week: 0.0 standard drinks of alcohol   Drug use: No     Colonoscopy: Refuses  PAP: Remote  Bone density: 09/2017   Allergies  Allergen Reactions   Other     Almond, lemon, chocolate, barley   Amoxicillin Rash    Flushed face/fevery   Wellbutrin [Bupropion] Other (See Comments)    Max tolerated dose is 154m daily.  Irritable on higher dose.     Current Outpatient Medications  Medication Sig Dispense Refill   ALPRAZolam (XANAX) 0.25 MG tablet TAKE 1 TABLET BY MOUTH DAILY AS NEEDED FOR ANXIETY( FOR FLIGHTS IF NEEDED). MAY CAUSE DROWSINESS. 10 tablet 1   anastrozole (ARIMIDEX) 1 MG tablet Take 1 tablet (1 mg total) by mouth daily. Start 06/07/2020 90 tablet 4   aspirin EC 81 MG tablet Take 81 mg by mouth daily. Swallow whole.     buPROPion ER (WELLBUTRIN SR) 100 MG 12 hr tablet TAKE 1 TABLET(100 MG) BY MOUTH DAILY 90 tablet 0   Cholecalciferol (VITAMIN D) 50 MCG (2000  UT) tablet Take 2,000 Units by mouth daily.     citalopram (CELEXA) 20 MG tablet TAKE 1/2 TABLET(10 MG) BY MOUTH DAILY.  Please dispense med from same manufacturer. 45 tablet 3   diphenhydrAMINE (BENADRYL ALLERGY) 25 mg capsule Take 1 capsule (25 mg total) by mouth every 6 (six) hours as needed. 30 capsule 0   FIBER FORMULA PO Take 1 capsule by mouth 2 (two) times daily.     olmesartan (BENICAR) 5 MG tablet Take 1 tablet (5 mg total) by mouth daily. 90 tablet 3   pravastatin (PRAVACHOL) 40 MG tablet TAKE 1 TABLET BY MOUTH AT NIGHT 90 tablet 3   No current facility-administered medications for this visit.    OBJECTIVE: White woman who appears younger than stated age  26:    11/14/21 1416  BP: (!) 163/80  Pulse: (!) 108  Resp: 18  Temp: 98.1 F (36.7 C)  SpO2: 95%     Body mass index is 28.06 kg/m.   Wt Readings from Last 3 Encounters:  11/14/21 158 lb 6.4 oz (71.8 kg)  11/14/20 165 lb 8 oz (75.1 kg)  11/04/20 166 lb (75.3 kg)     ECOG FS:1 - Symptomatic but completely ambulatory Physical Exam Constitutional:      Appearance: Normal appearance.  Chest:     Comments: Bilateral breasts inspected.  No palpable masses or regional adenopathy Musculoskeletal:        General: No swelling.     Cervical back: Normal range of motion and neck supple. No rigidity.  Lymphadenopathy:     Cervical: No cervical adenopathy.  Neurological:     Mental Status: She is alert.       LAB RESULTS:  CMP     Component Value Date/Time   NA 139 11/14/2021 0745   K 3.8 11/14/2021 0745   CL 104 11/14/2021 0745   CO2 27 11/14/2021 0745   GLUCOSE 97 11/14/2021 0745   BUN 17 11/14/2021 0745   CREATININE 0.91 11/14/2021 0745   CREATININE 0.90 01/13/2020 1218   CALCIUM 9.5 11/14/2021 0745   PROT 7.2 11/14/2021 0745   ALBUMIN 4.2 11/14/2021 0745   AST 18 11/14/2021 0745   AST 17 01/13/2020 1218   ALT 22 11/14/2021 0745   ALT 25 01/13/2020 1218   ALKPHOS 167 (H) 11/14/2021 0745   BILITOT 0.6 11/14/2021 0745   BILITOT 0.4 01/13/2020 1218   GFRNONAA >60 01/13/2020 1218   GFRAA >60 01/13/2020 1218    No results found for: "TOTALPROTELP", "ALBUMINELP", "A1GS", "A2GS", "BETS", "BETA2SER", "GAMS", "MSPIKE", "SPEI"  Lab Results  Component Value Date   WBC 4.9 11/14/2021   NEUTROABS 2.5 11/14/2021   HGB 14.2 11/14/2021   HCT 42.8 11/14/2021   MCV 89.7 11/14/2021   PLT 213.0 11/14/2021    No results found for: "LABCA2"  No components found for: "NATFTD322"  No results for input(s): "INR" in the last 168 hours.  No results found for: "LABCA2"  No results found for: "GUR427"  No results found for: "CAN125"  No results found for: "CAN153"  No results  found for: "CA2729"  No components found for: "HGQUANT"  No results found for: "CEA1", "CEA" / No results found for: "CEA1", "CEA"   No results found for: "AFPTUMOR"  No results found for: "CHROMOGRNA"  No results found for: "KPAFRELGTCHN", "LAMBDASER", "KAPLAMBRATIO" (kappa/lambda light chains)  No results found for: "HGBA", "HGBA2QUANT", "HGBFQUANT", "HGBSQUAN" (Hemoglobinopathy evaluation)   No results found for: "LDH"  No results found for: "IRON", "  TIBC", "IRONPCTSAT" (Iron and TIBC)  No results found for: "FERRITIN"  Urinalysis    Component Value Date/Time   COLORURINE YELLOW 05/18/2013 1037   APPEARANCEUR CLEAR 05/18/2013 1037   LABSPEC 1.006 05/18/2013 1037   PHURINE 7.0 05/18/2013 1037   GLUCOSEU NEG 05/18/2013 1037   HGBUR NEG 05/18/2013 1037   BILIRUBINUR NEG 05/18/2013 1037   KETONESUR NEG 05/18/2013 1037   PROTEINUR NEG 05/18/2013 1037   UROBILINOGEN 0.2 05/18/2013 1037   NITRITE NEG 05/18/2013 1037   LEUKOCYTESUR MOD (A) 05/18/2013 1037     STUDIES: AL: Z85.3 - Personal History Of Breast Cancer Annual Mammogram. Personal history of bilateral breast cancer in 02/2020 with bilateral lumpectomies.  Digital breast tomosynthesis imaging has been obtained as part of this examination. Comparison is made to exam(s) dated: 12/02/2019, 12/14/2019. Breast Composition Category B: The breast tissue is composed of scattered fibroglandular density.  Current study was also evaluated with a Computer Aided Detection (CAD) system. Post surgical distortion and density at the superior left lumpectomy site are unchanged. New post surgical architectural distortion in the lateral right breast, posterior depth consistent with interval lumpectomy. No significant masses, calcifications, or other findings are seen in either breast.  IMPRESSION: BENIGN Stable left lumpectomy. New post treatment changes of the right breast. There is no mammographic evidence of malignancy. A  follow-up mammogram in 12 months is recommended.(11/01/2021)   This exam was interpreted at the [facility] location.  Sheila Lee M.D. sl/penrad:11/01/2020 10:50:44   Letter: Verbal- These findings were given to the patient verbally and in writing at the time of examination. BI-RADS Category 2: Benign   Electronically Signed By: Sheila Lee MD   ELIGIBLE FOR AVAILABLE RESEARCH PROTOCOL: no  ASSESSMENT: 76 y.o. [place] woman status post bilateral breast biopsies 01/06/2020, showing  (1) on the right, an invasive ductal carcinoma, grade 1, measuring 0.4 cm (T1a), estrogen and progesterone receptor positive, with an MIB-1 of 1% and HER-2 not amplified  (2 on the left, and invasive ductal carcinoma, grade 1, measuring 1.0 cm. (T1b), estrogen receptor positive, progesterone receptor negative, with an MIB-1 of 1%.  HER-2 was not amplified  (3) status post bilateral lumpectomies 02/11/2020 showing bilateral ductal carcinoma in situ, both grade 2, with no evidence of invasive disease, and negative margins  (4) adjuvant radiation 04/13/20 - 05/13/20  Site/dose:   The patient initially received a dose of 42.56 Gy in 16 fractions to the bilateral breasts using whole-breast tangent fields. This was delivered using a 3-D conformal technique. The patient then received a boost to each seroma. This delivered an additional 8 Gy in 4 fractions using a 3 field photon technique on each side due to the depth of the seroma. The total dose to each breast was 50.56 Gy.  (5) anastrozole started 06/02/2020  (a) bone density at [facility] 01/27/2020 showed a T score of -1.5   PLAN:  She is tolerating anastrozole well except for some hot flashes.  She is however is already on Celexa and Wellbutrin, hence Dr. Magrinat could not add venlafaxine. She says overall her hot flashes have significantly improved compared to her last visit with Dr. Magrinat.  She is understandably very nervous about her upcoming mammogram, no  concerns on physical examination today.  Strongly encouraged her to continue walking like she used to, she is due for a bone density in September 2023, will fax requisition to [facility]. In the interim she should continue vitamin D, she cannot tolerate calcium supplements.  She should continue weightbearing exercises   With regards to her fatigue when she wakes up, I wonder if she has sleep apnea, recommended sleep study but she is not willing to do this at this time.  She tells me that she does not want to really wear the sleep apnea machine.  Return to clinic in 1 year Total time spent: 30 minutes  *Total Encounter Time as defined by the Centers for Medicare and Medicaid Services includes, in addition to the face-to-face time of a patient visit (documented in the note above) non-face-to-face time: obtaining and reviewing outside history, ordering and reviewing medications, tests or procedures, care coordination (communications with other health care professionals or caregivers) and documentation in the medical record. 

## 2021-11-15 DIAGNOSIS — Z853 Personal history of malignant neoplasm of breast: Secondary | ICD-10-CM | POA: Diagnosis not present

## 2021-11-17 ENCOUNTER — Ambulatory Visit: Payer: Medicare Other | Admitting: Family Medicine

## 2021-11-22 ENCOUNTER — Other Ambulatory Visit: Payer: Self-pay | Admitting: Family Medicine

## 2021-11-22 ENCOUNTER — Other Ambulatory Visit: Payer: Self-pay | Admitting: *Deleted

## 2021-11-22 MED ORDER — ANASTROZOLE 1 MG PO TABS: 1.0000 mg | ORAL_TABLET | Freq: Every day | ORAL | 4 refills | Status: DC

## 2021-11-22 NOTE — Telephone Encounter (Signed)
Refill request for Pravastatin 40 mg tabs  LOV - 11/04/20 Next OV - 12/07/21 Last refill - 09/15/20 #90/3

## 2021-12-07 ENCOUNTER — Encounter: Payer: Self-pay | Admitting: Family Medicine

## 2021-12-07 ENCOUNTER — Ambulatory Visit (INDEPENDENT_AMBULATORY_CARE_PROVIDER_SITE_OTHER): Payer: Medicare Other | Admitting: Family Medicine

## 2021-12-07 VITALS — BP 134/62 | HR 80 | Temp 97.8°F | Ht 63.0 in | Wt 160.0 lb

## 2021-12-07 DIAGNOSIS — I10 Essential (primary) hypertension: Secondary | ICD-10-CM | POA: Diagnosis not present

## 2021-12-07 DIAGNOSIS — F32A Depression, unspecified: Secondary | ICD-10-CM | POA: Diagnosis not present

## 2021-12-07 DIAGNOSIS — R748 Abnormal levels of other serum enzymes: Secondary | ICD-10-CM | POA: Diagnosis not present

## 2021-12-07 DIAGNOSIS — Z1283 Encounter for screening for malignant neoplasm of skin: Secondary | ICD-10-CM

## 2021-12-07 DIAGNOSIS — E7800 Pure hypercholesterolemia, unspecified: Secondary | ICD-10-CM | POA: Diagnosis not present

## 2021-12-07 DIAGNOSIS — Z170 Estrogen receptor positive status [ER+]: Secondary | ICD-10-CM

## 2021-12-07 DIAGNOSIS — Z0000 Encounter for general adult medical examination without abnormal findings: Secondary | ICD-10-CM

## 2021-12-07 DIAGNOSIS — Z7189 Other specified counseling: Secondary | ICD-10-CM

## 2021-12-07 MED ORDER — BUPROPION HCL ER (SR) 100 MG PO TB12: ORAL_TABLET | ORAL | 3 refills | Status: DC

## 2021-12-07 MED ORDER — OLMESARTAN MEDOXOMIL 5 MG PO TABS: 5.0000 mg | ORAL_TABLET | Freq: Every day | ORAL | 3 refills | Status: DC

## 2021-12-07 MED ORDER — CITALOPRAM HYDROBROMIDE 20 MG PO TABS
ORAL_TABLET | ORAL | 3 refills | Status: DC
Start: 2021-12-07 — End: 2022-12-13

## 2021-12-07 NOTE — Patient Instructions (Addendum)
If lightheaded, then cut the olmesartan in half and let me know.   Update me as needed.  Take care.  Glad to see you.

## 2021-12-07 NOTE — Progress Notes (Signed)
I have personally reviewed the Medicare Annual Wellness questionnaire and have noted 1. The patient's medical and social history 2. Their use of alcohol, tobacco or illicit drugs 3. Their current medications and supplements 4. The patient's functional ability including ADL's, fall risks, home safety risks and hearing or visual             impairment. 5. Diet and physical activities 6. Evidence for depression or mood disorders  The patients weight, height, BMI have been recorded in the chart and visual acuity is per eye clinic.  I have made referrals, counseling and provided education to the patient based review of the above and I have provided the pt with a written personalized care plan for preventive services.  Provider list updated- see scanned forms.  Routine anticipatory guidance given to patient.  See health maintenance. The possibility exists that previously documented standard health maintenance information may have been brought forward from a previous encounter into this note.  If needed, that same information has been updated to reflect the current situation based on today's encounter.    Flu 2022 Shingles 2021 PNA previously done Tetanus 2015 COVID-vaccine previously done Colon cancer screening declined by patient. Breast cancer screening 2023 Bone density test pending. Advance directive-son designated if patient were incapacitated. Cognitive function addressed- see scanned forms- and if abnormal then additional documentation follows.  Memory testing declined by patient.  In addition to [facility] Wellness, follow up visit for the below conditions:  Mood is good with citalopram and wellbutrin.  She has used xanax with flying.  She was able to fly to [facility] but felt tense and "couldn't sit still."  D/w pt about use, she is flying to [place] this year.    Hypertension:    Using medication without problems or lightheadedness:  yes Chest pain with exertion:no Edema:no Short of  breath:no Labs d/w pt.    Elevated Cholesterol: Using medications without problems:yes Muscle aches: no Diet compliance: yes Exercise: yes  Alk phos elevation d/w pt.  Prev bone scan with IMPRESSION: No evidence of osseous metastatic disease.  Still on anastrozole.  Compliant. Hot flashes improved in the meantime.    Meds, vitals, and allergies reviewed.  ROS: Per HPI.  Unless specifically indicated otherwise in HPI, the patient denies:  General: fever. Eyes: acute vision changes ENT: sore throat Cardiovascular: chest pain Respiratory: SOB GI: vomiting GU: dysuria Musculoskeletal: acute back pain Derm: acute rash Neuro: acute motor dysfunction Psych: worsening mood Endocrine: polydipsia Heme: bleeding Allergy: hayfever  GEN: nad, alert and oriented HEENT: ncat NECK: supple w/o LA CV: rrr. PULM: ctab, no inc wob ABD: soft, +bs EXT: no edema SKIN: Well-perfused.

## 2021-12-08 NOTE — Assessment & Plan Note (Signed)
Mood is good with citalopram and wellbutrin.  She has used xanax with flying.  She was able to fly to [facility] but felt tense and "couldn't sit still."  D/w pt about use, she is flying to [place] this year.   We will continue citalopram and Wellbutrin as is with as needed Xanax.

## 2021-12-08 NOTE — Assessment & Plan Note (Signed)
Flu 2022 Shingles 2021 PNA previously done Tetanus 2015 COVID-vaccine previously done Colon cancer screening declined by patient. Breast cancer screening 2023 Bone density test pending. Advance directive-son designated if patient were incapacitated. Cognitive function addressed- see scanned forms- and if abnormal then additional documentation follows.  Memory testing declined by patient.

## 2021-12-08 NOTE — Assessment & Plan Note (Signed)
Continue olmesartan but if she gets lightheaded or has lower blood pressure she could either cut the medication in half or stop it if needed.  Labs discussed with patient.

## 2021-12-08 NOTE — Assessment & Plan Note (Signed)
Continue pravastatin.  Continue work on diet and exercise. 

## 2021-12-08 NOTE — Assessment & Plan Note (Signed)
  Alk phos elevation d/w pt.  Prev bone scan with IMPRESSION: No evidence of osseous metastatic disease. Recent results not significantly different from previous.  No new symptoms.  Unclear source but it does not appear to be an ominous process.

## 2021-12-08 NOTE — Assessment & Plan Note (Signed)
History of.  Per outside clinic.

## 2022-01-24 ENCOUNTER — Other Ambulatory Visit: Payer: Self-pay | Admitting: Family Medicine

## 2022-01-24 NOTE — Telephone Encounter (Signed)
Refill request for ALPRAZOLAM 0.'25MG'$  TABLETS  LOV - 12/07/21 Next OV - not scheduled Last refill - 11/04/21 #10/1  *Also needs correct celexa rx refilled

## 2022-05-26 IMAGING — NM NM BONE WHOLE BODY
4 series · 4 of 4 positions shown · non-contrast
Comparison: None.

CLINICAL DATA: Breast cancer staging. Elevated alkaline
phosphatase.

EXAM:
NUCLEAR MEDICINE WHOLE BODY BONE SCAN
TECHNIQUE: Whole body anterior and posterior images were obtained approximately
3 hours after intravenous injection of radiopharmaceutical.
RADIOPHARMACEUTICALS:  21.5 mCi Technetium-22m MDP IV

[Series 1: whole body · 2.66mm/px · 1 of 1 slices shown (1 of 2)]
[im 1/1]
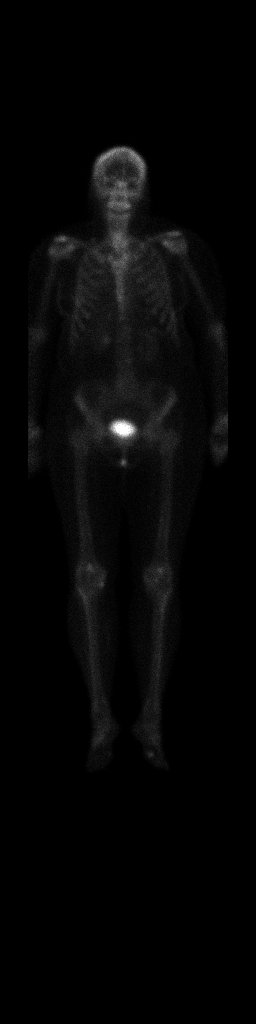

[Series 1: whole body · 2.66mm/px · 1 of 1 slices shown (2 of 2)]
[im 1/1]
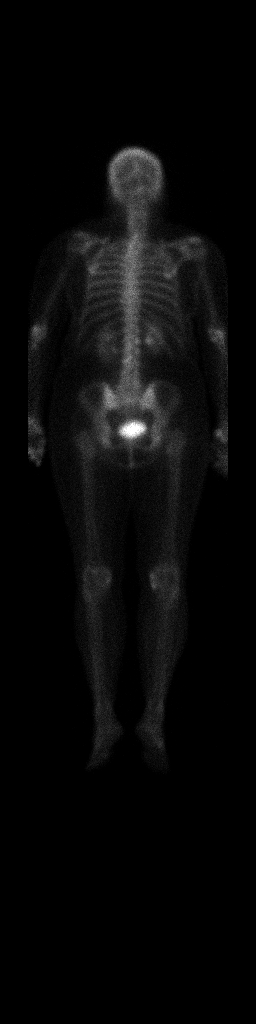

[Series 1: wbr_bone_40 whole body · 2.66mm/px · 1 of 1 slices shown (1 of 2)]
[im 1/1]
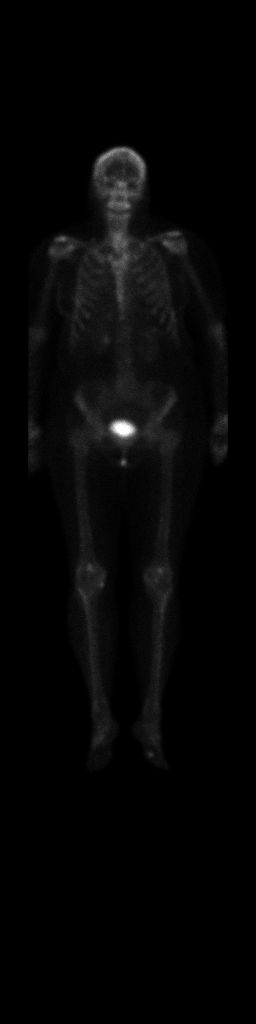

[Series 1: wbr_bone_40 whole body · 2.66mm/px · 1 of 1 slices shown (2 of 2)]
[im 1/1]
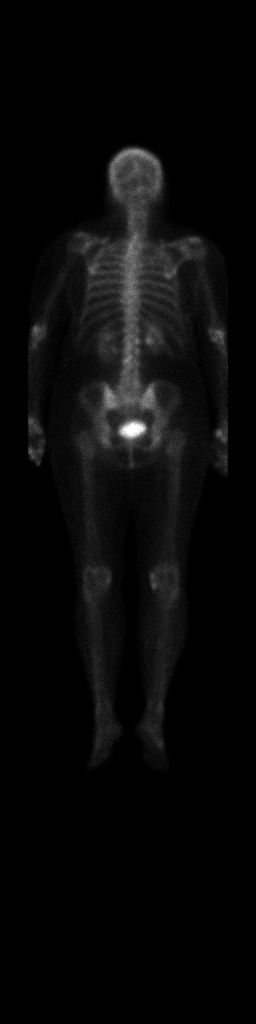

[4 of 4 positions shown; findings below may reference images not displayed]

FINDINGS: Normal distribution of radiotracer throughout the axial and
appendicular skeleton as well as within the urinary tract. Minimal
uptake present within the left great toe MTP joint, knees, and
shoulder girdles consistent with degenerative change. No evidence of
metastatic disease.
IMPRESSION: No evidence of osseous metastatic disease.

## 2022-06-22 DIAGNOSIS — H25813 Combined forms of age-related cataract, bilateral: Secondary | ICD-10-CM | POA: Diagnosis not present

## 2022-06-22 DIAGNOSIS — H353131 Nonexudative age-related macular degeneration, bilateral, early dry stage: Secondary | ICD-10-CM | POA: Diagnosis not present

## 2022-09-10 ENCOUNTER — Encounter: Payer: Self-pay | Admitting: Family Medicine

## 2022-10-05 ENCOUNTER — Telehealth: Payer: Self-pay | Admitting: Hematology and Oncology

## 2022-10-10 ENCOUNTER — Other Ambulatory Visit: Payer: Self-pay | Admitting: Family Medicine

## 2022-10-10 MED ORDER — OLMESARTAN MEDOXOMIL 5 MG PO TABS: 10.0000 mg | ORAL_TABLET | Freq: Every day | ORAL | 3 refills | Status: DC

## 2022-10-12 DIAGNOSIS — Z1283 Encounter for screening for malignant neoplasm of skin: Secondary | ICD-10-CM | POA: Diagnosis not present

## 2022-10-12 DIAGNOSIS — D225 Melanocytic nevi of trunk: Secondary | ICD-10-CM | POA: Diagnosis not present

## 2022-10-12 DIAGNOSIS — L820 Inflamed seborrheic keratosis: Secondary | ICD-10-CM | POA: Diagnosis not present

## 2022-10-14 ENCOUNTER — Other Ambulatory Visit: Payer: Self-pay | Admitting: Family Medicine

## 2022-10-14 NOTE — Telephone Encounter (Signed)
I cannot enter Benicar 10 mg in the EMR.  Please call in Benicar 10 mg.  1 tab by mouth daily.  Dispense 90.  3 refills.  Please enter it is nonformulary in the EMR.  Thanks.

## 2022-10-15 NOTE — Addendum Note (Signed)
Addended by: Wendie Simmer B on: 10/15/2022 02:54 PM   Modules accepted: Orders

## 2022-10-15 NOTE — Telephone Encounter (Signed)
Fixed rx in EMR; called rx into [facility].

## 2022-11-15 ENCOUNTER — Ambulatory Visit: Payer: Medicare Other | Admitting: Hematology and Oncology

## 2022-11-19 DIAGNOSIS — M8588 Other specified disorders of bone density and structure, other site: Secondary | ICD-10-CM | POA: Diagnosis not present

## 2022-11-19 DIAGNOSIS — Z853 Personal history of malignant neoplasm of breast: Secondary | ICD-10-CM | POA: Diagnosis not present

## 2022-11-19 DIAGNOSIS — Z1231 Encounter for screening mammogram for malignant neoplasm of breast: Secondary | ICD-10-CM | POA: Diagnosis not present

## 2022-11-20 ENCOUNTER — Encounter: Payer: Self-pay | Admitting: Hematology and Oncology

## 2022-11-22 ENCOUNTER — Inpatient Hospital Stay: Payer: Medicare Other | Attending: Hematology and Oncology | Admitting: Hematology and Oncology

## 2022-11-22 ENCOUNTER — Encounter: Payer: Self-pay | Admitting: Hematology and Oncology

## 2022-11-22 ENCOUNTER — Other Ambulatory Visit: Payer: Self-pay

## 2022-11-22 VITALS — BP 155/64 | HR 98 | Temp 97.7°F | Resp 16 | Wt 161.6 lb

## 2022-11-22 DIAGNOSIS — R232 Flushing: Secondary | ICD-10-CM | POA: Diagnosis not present

## 2022-11-22 DIAGNOSIS — Z79811 Long term (current) use of aromatase inhibitors: Secondary | ICD-10-CM | POA: Diagnosis not present

## 2022-11-22 DIAGNOSIS — C50412 Malignant neoplasm of upper-outer quadrant of left female breast: Secondary | ICD-10-CM | POA: Diagnosis not present

## 2022-11-22 DIAGNOSIS — R921 Mammographic calcification found on diagnostic imaging of breast: Secondary | ICD-10-CM | POA: Diagnosis not present

## 2022-11-22 DIAGNOSIS — Z170 Estrogen receptor positive status [ER+]: Secondary | ICD-10-CM | POA: Insufficient documentation

## 2022-11-22 DIAGNOSIS — R922 Inconclusive mammogram: Secondary | ICD-10-CM | POA: Diagnosis not present

## 2022-11-22 DIAGNOSIS — C50411 Malignant neoplasm of upper-outer quadrant of right female breast: Secondary | ICD-10-CM | POA: Diagnosis not present

## 2022-11-22 DIAGNOSIS — M8580 Other specified disorders of bone density and structure, unspecified site: Secondary | ICD-10-CM | POA: Diagnosis not present

## 2022-11-22 DIAGNOSIS — Z923 Personal history of irradiation: Secondary | ICD-10-CM | POA: Insufficient documentation

## 2022-11-22 NOTE — Progress Notes (Signed)
[facility]  Telephone:([phone]) [phone] [phone]) [phone]     ID: Sheila Nguyen DOB: 05/29/1945  MR#: [id number]  GLO#:[id number]  Patient Care Team: Joaquim Nam, MD as PCP - General (Family Medicine) Pershing Proud, RN as Oncology Nurse Navigator Donnelly Angelica, RN as Oncology Nurse Navigator Abigail Miyamoto, MD as Consulting Physician (General Surgery) Malachy Mood, MD as Consulting Physician (Hematology) Dorothy Puffer, MD as Consulting Physician (Radiation Oncology) Magrinat, Valentino Hue, MD (Inactive) as Consulting Physician (Oncology) Rachel Moulds, MD  CHIEF COMPLAINT: Estrogen receptor positive breast cancer  CURRENT TREATMENT: Anastrozole  INTERVAL HISTORY:  Sheila Nguyen returns today for follow up of her estrogen receptor positive breast cancer. Since her last visit here she continues on anastrozole.  She had a couple concerns today to discuss.  She most recently had a bone density scan which showed decreased bone density and she was worried about this. She also had a mammogram which did not show any evidence of malignancy she states. She is otherwise doing well, no breast changes reported. Rest of the pertinent 10 point ROS reviewed and negative   COVID 19 VACCINATION STATUS: Moderna x2, with booster September 2021   HISTORY OF CURRENT ILLNESS: From a prior intake note:  Sheila Nguyen had routine screening mammography on 12/02/2019 showing a possible abnormality in the bilateral breasts. She underwent bilateral diagnostic mammography with tomography and bilateral breast ultrasonography at [facility] on 12/14/2019 showing: breast density category B; 3 mm irregular mass in right breast at 9:30; cysts in left breast at 12 o'clock on ultrasound have irregular margins on mammogram; benign-appearing lymph nodes in right axilla.  Accordingly on 01/06/2020 she proceeded to biopsy of the bilateral breast areas in question. The pathology from this procedure ([id number])  showed:  1. 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 Proliferation marker Ki67 at 1%. HER2 negative by immunohistochemistry (1+). 2. Right Breast  - invasive ductal carcinoma, grade 1  - ductal carcinoma in situ  - Prognostic indicators significant for: estrogen receptor, 80% positive and progesterone receptor, 10% positive, both with strong staining intensity. Proliferation marker Ki67 at 1%. HER2 negative by immunohistochemistry (1+).  She underwent bilateral lumpectomies on 02/11/2020 under Dr. Magnus Ivan. Pathology from the procedure [id number]) showed: 1. Left Breast  - ductal carcinoma in situ, intermediate grade  - columnar cell and fibrocystic changes with apocrine metaplasia  - focal atypical ductal hyperplasia  - lobular neoplasia   - no residual carcinoma 2. Right Breast  - ductal carcinoma in situ, intermediate grade  - adenosis, columnar cell, and fibrocystic changes  - no residual carcinoma  She subsequently received radiation therapy from 04/13/2020 through 05/13/2020 under Dr. Mitzi Hansen.   Cancer Staging  Malignant neoplasm of upper-outer quadrant of left breast in female, estrogen receptor positive (HCC) Staging form: Breast, AJCC 8th Edition - Clinical stage from 01/13/2020: Stage IA (cT1b, cN0, cM0, G1, ER+, PR-, HER2-) - Signed by Lowella Dell, MD on 06/02/2020 Stage prefix: Initial diagnosis Method of lymph node assessment: Clinical Histologic grading system: 3 grade system  Malignant neoplasm of upper-outer quadrant of right breast in female, estrogen receptor positive (HCC) Staging form: Breast, AJCC 8th Edition - Clinical stage from 01/13/2020: Stage IA (cT1a, cN0, cM0, G1, ER+, PR+, HER2-) - Unsigned Stage prefix: Initial diagnosis Method of lymph node assessment:  Clinical Histologic grading system:  3 grade system   The patient's subsequent history is as detailed below.   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  The patient's mother died at age 81.  The patient has 1 brother, no sisters.  She is not aware of any cancer history in the family   GYNECOLOGIC HISTORY:  No LMP recorded. Patient is postmenopausal. Menarche: 77 years old Age at first live birth: 77 years old GX P 1 (+1 adopted child). LMP in her late 75's Contraceptive: used for 3 years HRT yes  Hysterectomy? no BSO? no   SOCIAL HISTORY: (updated January 2022 Sheila Nguyen is originally from [place].  She moved to [place] approximately age 82.  She worked as a Lawyer.  Her husband died from urothelial cancer in the setting of tobacco abuse.  She lives by herself.  Patient's adopted daughter Sheila Nguyen is a International aid/development worker in [place], [place].  The patient's son Sheila Nguyen works for [employer] in [place].   The patient has no grandchildren.  She is a International aid/development worker    ADVANCED DIRECTIVES:    HEALTH MAINTENANCE: Social History   Tobacco Use   Smoking status: Never   Smokeless tobacco: Never   Substance Use Topics   Alcohol use: No    Alcohol/week: 0.0 standard drinks of alcohol   Drug use: No     Colonoscopy: Refuses  PAP: Remote  Bone density: 09/2017   Allergies  Allergen Reactions   Other     Almond, lemon, chocolate, barley   Amoxicillin Rash    Flushed face/fevery   Wellbutrin [Bupropion] Other (See Comments)    Max tolerated dose is 100mg  daily.  Irritable on higher dose.     Current Outpatient Medications  Medication Sig Dispense Refill   ALPRAZolam (XANAX) 0.25 MG tablet TAKE ONE TABLET BY MOUTH DAILY AS NEEDED FOR ANXIETY. MAY CAUSE DROWSINESS 10 tablet 1   anastrozole (ARIMIDEX) 1 MG tablet Take 1 tablet (1 mg total) by mouth daily. Start 06/07/2020 90 tablet 4   aspirin EC 81 MG tablet Take 81 mg by mouth daily. Swallow whole.     buPROPion ER (WELLBUTRIN SR) 100 MG 12 hr tablet TAKE 1 TABLET(100 MG) BY MOUTH DAILY 90 tablet 3   Cholecalciferol (VITAMIN D) 50 MCG (2000 UT) tablet Take 2,000 Units by mouth daily.     citalopram (CELEXA) 10 MG tablet TAKE 1 TABLET(10 MG) BY MOUTH DAILY 90 tablet 3   citalopram (CELEXA) 20 MG tablet TAKE 1/2 TABLET(10 MG) BY MOUTH DAILY.  Please dispense med from same manufacturer. 45 tablet 3   FIBER FORMULA PO Take  1 capsule by mouth 2 (two) times daily.     Olmesartan Medoxomil (BENICAR PO) Take 10 mg by mouth daily. Take one tablet by mouth once a day     pravastatin (PRAVACHOL) 40 MG tablet TAKE 1 TABLET BY MOUTH AT NIGHT 90 tablet 3   No current facility-administered medications for this visit.    OBJECTIVE: White woman who appears younger than stated age  Vitals:   11/22/22 1430  BP: (!) 155/64  Pulse: 98  Resp: 16  Temp: 97.7 F (36.5 C)  SpO2: 93%     Body mass index is 28.63 kg/m.   Wt Readings from Last 3 Encounters:  11/22/22 161 lb 9.6 oz (73.3 kg)  12/07/21 160 lb (72.6 kg)  11/14/21 158 lb 6.4 oz (71.8 kg)     ECOG FS:1 - Symptomatic but completely ambulatory Physical Exam Constitutional:       Appearance: Normal appearance.  Chest:     Comments: Bilateral breasts inspected.  No palpable masses or regional adenopathy Musculoskeletal:        General: No swelling.     Cervical back: Normal range of motion and neck supple. No rigidity.  Lymphadenopathy:     Cervical: No cervical adenopathy.  Neurological:     Mental Status: She is alert.      LAB RESULTS:  CMP     Component Value Date/Time   NA 139 11/14/2021 0745   K 3.8 11/14/2021 0745   CL 104 11/14/2021 0745   CO2 27 11/14/2021 0745   GLUCOSE 97 11/14/2021 0745   BUN 17 11/14/2021 0745   CREATININE 0.91 11/14/2021 0745   CREATININE 0.90 01/13/2020 1218   CALCIUM 9.5 11/14/2021 0745   PROT 7.2 11/14/2021 0745   ALBUMIN 4.2 11/14/2021 0745   AST 18 11/14/2021 0745   AST 17 01/13/2020 1218   ALT 22 11/14/2021 0745   ALT 25 01/13/2020 1218   ALKPHOS 167 (H) 11/14/2021 0745   BILITOT 0.6 11/14/2021 0745   BILITOT 0.4 01/13/2020 1218   GFRNONAA >60 01/13/2020 1218   GFRAA >60 01/13/2020 1218    No results found for: "TOTALPROTELP", "ALBUMINELP", "A1GS", "A2GS", "BETS", "BETA2SER", "GAMS", "MSPIKE", "SPEI"  Lab Results  Component Value Date   WBC 4.9 11/14/2021   NEUTROABS 2.5 11/14/2021   HGB 14.2 11/14/2021   HCT 42.8 11/14/2021   MCV 89.7 11/14/2021   PLT 213.0 11/14/2021    No results found for: "LABCA2"  No components found for: "NWGNFA213"  No results for input(s): "INR" in the last 168 hours.  No results found for: "LABCA2"  No results found for: "YQM578"  No results found for: "CAN125"  No results found for: "CAN153"  No results found for: "CA2729"  No components found for: "HGQUANT"  No results found for: "CEA1", "CEA" / No results found for: "CEA1", "CEA"   No results found for: "AFPTUMOR"  No results found for: "CHROMOGRNA"  No results found for: "KPAFRELGTCHN", "LAMBDASER", "KAPLAMBRATIO" (kappa/lambda light chains)  No results found for: "HGBA", "HGBA2QUANT",  "HGBFQUANT", "HGBSQUAN" (Hemoglobinopathy evaluation)   No results found for: "LDH"  No results found for: "IRON", "TIBC", "IRONPCTSAT" (Iron and TIBC)  No results found for: "FERRITIN"  Urinalysis    Component Value Date/Time   COLORURINE YELLOW 05/18/2013 1037   APPEARANCEUR CLEAR 05/18/2013 1037   LABSPEC 1.006 05/18/2013 1037   PHURINE 7.0 05/18/2013 1037   GLUCOSEU NEG 05/18/2013 1037   HGBUR NEG 05/18/2013 1037   BILIRUBINUR NEG 05/18/2013 1037   KETONESUR  NEG 05/18/2013 1037   PROTEINUR NEG 05/18/2013 1037   UROBILINOGEN 0.2 05/18/2013 1037   NITRITE NEG 05/18/2013 1037   LEUKOCYTESUR MOD (A) 05/18/2013 1037     STUDIES: AL: Z85.3 - Personal History Of Breast Cancer Annual Mammogram. Personal history of bilateral breast cancer in 02/2020 with bilateral lumpectomies.  Digital breast tomosynthesis imaging has been obtained as part of this examination. Comparison is made to exam(s) dated: 12/02/2019, 12/14/2019. Breast Composition Category B: The breast tissue is composed of scattered fibroglandular density.  Current study was also evaluated with a Computer Aided Detection (CAD) system. Post surgical distortion and density at the superior left lumpectomy site are unchanged. New post surgical architectural distortion in the lateral right breast, posterior depth consistent with interval lumpectomy. No significant masses, calcifications, or other findings are seen in either breast.  IMPRESSION: BENIGN Stable left lumpectomy. New post treatment changes of the right breast. There is no mammographic evidence of malignancy. A follow-up mammogram in 12 months is recommended.(11/01/2021)   This exam was interpreted at the [facility] location.  Mayford Knife M.D. sl/penrad:11/01/2020 10:50:44   Letter: Verbal- These findings were given to the patient verbally and in writing at the time of examination. BI-RADS Category 2: Benign   Electronically Signed By: Mayford Knife  MD   ELIGIBLE FOR AVAILABLE RESEARCH PROTOCOL: no  ASSESSMENT: 78 y.o. [place] woman status post bilateral breast biopsies 01/06/2020, showing  (1) on the right, an invasive ductal carcinoma, grade 1, measuring 0.4 cm (T1a), estrogen and progesterone receptor positive, with an MIB-1 of 1% and HER-2 not amplified  (2 on the left, and invasive ductal carcinoma, grade 1, measuring 1.0 cm. (T1b), estrogen receptor positive, progesterone receptor negative, with an MIB-1 of 1%.  HER-2 was not amplified  (3) status post bilateral lumpectomies 02/11/2020 showing bilateral ductal carcinoma in situ, both grade 2, with no evidence of invasive disease, and negative margins  (4) adjuvant radiation 04/13/20 - 05/13/20  Site/dose:   The patient initially received a dose of 42.56 Gy in 16 fractions to the bilateral breasts using whole-breast tangent fields. This was delivered using a 3-D conformal technique. The patient then received a boost to each seroma. This delivered an additional 8 Gy in 4 fractions using a 3 field photon technique on each side due to the depth of the seroma. The total dose to each breast was 50.56 Gy.  (5) anastrozole started 06/02/2020  (a) bone density at [facility] 01/27/2020 showed a T score of -1.5   PLAN:  She is tolerating anastrozole well except for some hot flashes.   She states the hot flashes have gotten better since her last visit here, she wonders if she has gotten used to the medication.  She also had a mammogram and bone density recently and was concerned about the worsening bone density reported on the most recent mammogram.  We have gone over this, this shows worsening osteopenia.  We have discussed about several measures we can take to counteract osteopenia including weightbearing exercises, regular walking, vitamin D and calcium supplements as tolerated as well as bisphosphonates.  She is not very keen on starting on bisphosphonates especially with the possible adverse  effects.  [facility] and go back to walking. She otherwise reports no change in the mammogram concerning for malignancy.  We did not do a breast exam, she says she had breast exam twice this week.  Rest of the physical exam without  any concerns.  She will continue the measures we have discussed about and return to clinic in 1 year or sooner as needed Total time spent: 30 minutes  *Total Encounter Time as defined by the Centers for Medicare and Medicaid Services includes, in addition to the face-to-face time of a patient visit (documented in the note above) non-face-to-face time: obtaining and reviewing outside history, ordering and reviewing medications, tests or procedures, care coordination (communications with other health care professionals or caregivers) and documentation in the medical record.

## 2022-11-30 ENCOUNTER — Encounter: Payer: Self-pay | Admitting: Hematology and Oncology

## 2022-12-02 ENCOUNTER — Other Ambulatory Visit: Payer: Self-pay | Admitting: Family Medicine

## 2022-12-02 DIAGNOSIS — I10 Essential (primary) hypertension: Secondary | ICD-10-CM

## 2022-12-02 DIAGNOSIS — M8580 Other specified disorders of bone density and structure, unspecified site: Secondary | ICD-10-CM

## 2022-12-04 ENCOUNTER — Telehealth: Payer: Self-pay | Admitting: Family Medicine

## 2022-12-04 NOTE — Telephone Encounter (Signed)
error 

## 2022-12-04 NOTE — Telephone Encounter (Signed)
Patient is due for AWV; please call patient to schedule appt.

## 2022-12-04 NOTE — Telephone Encounter (Signed)
Spoke to pt, scheduled cpe for 01/03/23. Pt asked for lab orders be sent to [facility] on [address]?

## 2022-12-04 NOTE — Telephone Encounter (Signed)
Okay to order labs as requested.

## 2022-12-05 ENCOUNTER — Encounter (INDEPENDENT_AMBULATORY_CARE_PROVIDER_SITE_OTHER): Payer: Self-pay

## 2022-12-05 NOTE — Telephone Encounter (Signed)
Left message for patient that labs were ordered and to call that office to schedule her lab appt.

## 2022-12-05 NOTE — Telephone Encounter (Signed)
I put in the lab orders.  Please have her check with that clinic about getting scheduled for labs if she can't walk in for labs.  Thanks.

## 2022-12-05 NOTE — Addendum Note (Signed)
Addended by: Joaquim Nam on: 12/05/2022 08:35 AM   Modules accepted: Orders

## 2022-12-06 NOTE — Telephone Encounter (Signed)
Spoke to pt, let her know about orders being sent in

## 2022-12-13 ENCOUNTER — Ambulatory Visit (INDEPENDENT_AMBULATORY_CARE_PROVIDER_SITE_OTHER): Payer: Medicare Other

## 2022-12-13 VITALS — Wt 161.0 lb

## 2022-12-13 DIAGNOSIS — Z0000 Encounter for general adult medical examination without abnormal findings: Secondary | ICD-10-CM | POA: Diagnosis not present

## 2022-12-13 NOTE — Progress Notes (Signed)
Subjective:   Sheila Nguyen is a 77 y.o. female who presents for Medicare Annual (Subsequent) preventive examination.  Visit Complete: Virtual  I connected with  Sheila Nguyen on 12/13/22 by a audio enabled telemedicine application and verified that I am speaking with the correct person using two identifiers.  Patient Location: Home  Provider Location: Office/Clinic  I discussed the limitations of evaluation and management by telemedicine. The patient expressed understanding and agreed to proceed.  Patient Medicare AWV questionnaire was completed by the patient on 12/12/22; I have confirmed that all information answered by patient is correct and no changes since this date.  Vital Signs: Unable to obtain new vitals due to this being a telehealth visit.   Review of Systems     Cardiac Risk Factors include: advanced age (>28men, >20 women);dyslipidemia;hypertension     Objective:    Today's Vitals   12/13/22 1131  Weight: 161 lb (73 kg)   Body mass index is 28.52 kg/m.     12/13/2022   11:37 AM 03/15/2020   10:05 AM 02/11/2020    9:53 AM 02/08/2020   12:35 PM  Advanced Directives  Does Patient Have a Medical Advance Directive? Yes Yes No No  Type of Estate agent of [place];Living will Living will    Does patient want to make changes to medical advance directive?  No - Patient declined    Copy of Healthcare Power of Attorney in Chart? No - copy requested     Would patient like information on creating a medical advance directive?   No - Patient declined No - Patient declined    Current Medications (verified) Outpatient Encounter Medications as of 12/13/2022  Medication Sig   ALPRAZolam (XANAX) 0.25 MG tablet TAKE ONE TABLET BY MOUTH DAILY AS NEEDED FOR ANXIETY. MAY CAUSE DROWSINESS   anastrozole (ARIMIDEX) 1 MG tablet Take 1 tablet (1 mg total) by mouth daily. Start 06/07/2020   aspirin EC 81 MG tablet Take 81 mg by mouth daily. Swallow whole.    buPROPion ER (WELLBUTRIN SR) 100 MG 12 hr tablet TAKE 1 TABLET(100 MG) BY MOUTH DAILY   Cholecalciferol (VITAMIN D) 50 MCG (2000 UT) tablet Take 2,000 Units by mouth daily.   citalopram (CELEXA) 10 MG tablet TAKE 1 TABLET(10 MG) BY MOUTH DAILY   FIBER FORMULA PO Take 1 capsule by mouth 2 (two) times daily.   Olmesartan Medoxomil (BENICAR PO) Take 10 mg by mouth daily. Take one tablet by mouth once a day   pravastatin (PRAVACHOL) 40 MG tablet TAKE 1 TABLET BY MOUTH AT NIGHT   [DISCONTINUED] citalopram (CELEXA) 20 MG tablet TAKE 1/2 TABLET(10 MG) BY MOUTH DAILY.  Please dispense med from same manufacturer.   No facility-administered encounter medications on file as of 12/13/2022.    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   BREAST LUMPECTOMY WITH RADIOACTIVE SEED LOCALIZATION Bilateral 02/11/2020   Procedure: BILATERAL BREAST LUMPECTOMY WITH RADIOACTIVE SEED LOCALIZATIONS;  Surgeon: Abigail Miyamoto, MD;  Location: [facility];  Service: General;  Laterality: Bilateral;   CESAREAN SECTION  1979   Family History  Problem Relation Age of Onset   Alcohol abuse Father    Stroke Father    Colon cancer Neg Hx    Breast cancer Neg Hx    Social History   Socioeconomic History   Marital status: Widowed    Spouse name: Not on file   Number of children: 2   Years of education: Not on file   Highest education level: Not on file  Occupational History   Occupation: Lawyer    Employer: [employer]    Comment: BA  Psychology  Tobacco Use   Smoking status: Never   Smokeless tobacco: Never  Substance and Sexual Activity    Alcohol use: No    Alcohol/week: 0.0 standard drinks of alcohol   Drug use: No   Sexual activity: Not Currently    Birth control/protection: Post-menopausal  Other Topics Concern   Not on file  Social History Narrative   Widowed 06/2017.  Was prev divorced from first husband then had remarried longtime boyfriend.     From [place]   To [place] 2006   Retired Academic librarian from [place], Psychology degree from [employer]   Her kids are out of state.     Social Determinants of Health   Financial Resource Strain: Low Risk  (12/12/2022)   Overall Financial Resource Strain (CARDIA)    Difficulty of Paying Living Expenses: Not hard at all  Food Insecurity: No Food Insecurity (12/12/2022)   Hunger Vital Sign    Worried About Running Out of Food in the Last Year: Never true    Ran Out of Food in the Last Year: Never true  Transportation Needs: No Transportation Needs (12/12/2022)   PRAPARE - Administrator, Civil Service (Medical): No    Lack of Transportation (Non-Medical): No  Physical Activity: Insufficiently Active (12/12/2022)   Exercise Vital Sign    Days of Exercise per Week: 3 days    Minutes of Exercise per Session: 30 min  Stress: Stress Concern Present (12/12/2022)   [employer] of Occupational Health - Occupational Stress Questionnaire    Feeling of Stress : To some extent  Social Connections: Socially Isolated (12/12/2022)   Social Connection and Isolation Panel [NHANES]    Frequency of Communication with Friends and Family: More than three times a week    Frequency of Social Gatherings with Friends and Family: More than three times a week    Attends Religious Services: Never    Database administrator or Organizations: No    Attends Banker Meetings: Never    Marital Status: Widowed    Tobacco Counseling Counseling given: Not Answered   Clinical Intake:  Pre-visit preparation completed: Yes  Pain : No/denies pain      Nutritional Risks: None Diabetes: No  How often do you need to have someone help you when you read instructions, pamphlets, or other written materials from your doctor or pharmacy?: 1 - Never  Interpreter Needed?: No  Information entered by :: Lanier Ensign, LPN   Activities of Daily Living    12/12/2022   10:31 PM  In your present state of health, do you have any difficulty performing the following activities:  Hearing? 0  Vision? 0  Difficulty concentrating or making decisions? 0  Walking or climbing stairs? 0  Dressing or bathing? 0  Doing errands, shopping? 0  Preparing Food and eating ? N  Using the Toilet? N  In the past six months,  have you accidently leaked urine? Y  Do you have problems with loss of bowel control? N  Managing your Medications? N  Managing your Finances? N  Housekeeping or managing your Housekeeping? N    Patient Care Team: Joaquim Nam, MD as PCP - General (Family Medicine) Pershing Proud, RN as Oncology Nurse Navigator Donnelly Angelica, RN as Oncology Nurse Navigator Abigail Miyamoto, MD as Consulting Physician (General Surgery) Malachy Mood, MD as Consulting Physician (Hematology) Dorothy Puffer, MD as Consulting Physician (Radiation Oncology) Magrinat, Valentino Hue, MD (Inactive) as Consulting Physician (Oncology)  Indicate any recent Medical Services you may have received from other than [facility] providers in the past year (date may be approximate).     Assessment:   This is a routine wellness examination for [place].  Hearing/Vision screen Hearing Screening - Comments:: Pt denies any hearing issues  Vision Screening - Comments:: Pt follows up with Dr Hazle Quant for annual eye exams   Dietary issues and exercise activities discussed:     Goals Addressed             This Visit's Progress    Patient Stated       Work on weight loss        Depression Screen    12/13/2022   11:35 AM 12/07/2021    2:34 PM 01/13/2020    3:08 PM 10/06/2019     2:36 PM 12/15/2018   12:36 PM 04/21/2018   10:59 AM 03/19/2017    2:13 PM  PHQ 2/9 Scores  PHQ - 2 Score 0 0 2 0 2 0 0  PHQ- 9 Score     2      Fall Risk    12/12/2022   10:31 PM 12/07/2021    2:34 PM 10/06/2019    2:35 PM 04/21/2018   10:59 AM 03/19/2017    2:13 PM  Fall Risk   Falls in the past year? 0 0 0 0 No  Number falls in past yr: 0 0     Injury with Fall? 0 0     Risk for fall due to : Impaired vision No Fall Risks     Follow up Falls prevention discussed Falls evaluation completed       MEDICARE RISK AT HOME:   TIMED UP AND GO:  Was the test performed?  No    Cognitive Function:        12/13/2022   11:37 AM  6CIT Screen  What Year? 0 points  What month? 0 points  What time? 0 points  Count back from 20 0 points  Months in reverse 0 points  Repeat phrase 0 points  Total Score 0 points    Immunizations Immunization History  Administered Date(s) Administered   Hepatitis A 11/09/2011, 10/14/2012   Influenza Split 03/01/2011   Influenza, High Dose Seasonal PF 02/20/2016, 01/30/2019   Influenza-Unspecified 02/04/2013, 02/04/2018, 02/05/2020   Moderna Sars-Covid-2 Vaccination 05/19/2019, 06/16/2019, 03/07/2020   Pneumococcal Conjugate-13 10/22/2013   Pneumococcal Polysaccharide-23 07/20/2008   Td 05/07/2008   Tdap 05/20/2013   Zoster Recombinant(Shingrix) 10/18/2019, 06/05/2020   Zoster, Live 11/09/2011    TDAP status: Up to date  Flu Vaccine status: Due, Education has been provided regarding the importance of this vaccine. Advised may receive this vaccine at local pharmacy or Health Dept. Aware to provide a copy of the vaccination record if obtained from local pharmacy or Health Dept. Verbalized acceptance and understanding.  Pneumococcal vaccine status: Up to date  Covid-19 vaccine  status: Information provided on how to obtain vaccines.   Qualifies for Shingles Vaccine? Yes   Zostavax completed Yes   Shingrix Completed?: Yes  Screening  Tests Health Maintenance  Topic Date Due   COVID-19 Vaccine (4 - 2023-24 season) 01/05/2022   INFLUENZA VACCINE  12/06/2022   DTaP/Tdap/Td (3 - Td or Tdap) 05/21/2023   Medicare Annual Wellness (AWV)  12/13/2023   Pneumonia Vaccine 83+ Years old  Completed   DEXA SCAN  Completed   Hepatitis C Screening  Completed   Zoster Vaccines- Shingrix  Completed   HPV VACCINES  Aged Out    Health Maintenance  Health Maintenance Due  Topic Date Due   COVID-19 Vaccine (4 - 2023-24 season) 01/05/2022   INFLUENZA VACCINE  12/06/2022    Colorectal cancer screening: No longer required.   Mammogram status: Completed 11/22/22. Repeat every year  Bone Density status: Completed 11/19/22. Results reflect: Bone density results: OSTEOPENIA. Repeat every 2 years.  Additional Screening:  Hepatitis C Screening: Completed 02/23/16  Vision Screening: Recommended annual ophthalmology exams for early detection of glaucoma and other disorders of the eye. Is the patient up to date with their annual eye exam?  Yes  Who is the provider or what is the name of the office in which the patient attends annual eye exams? [facility]  If pt is not established with a provider, would they like to be referred to a provider to establish care? No .   Dental Screening: Recommended annual dental exams for proper oral hygiene   Community Resource Referral / Chronic Care Management: CRR required this visit?  No   CCM required this visit?  No     Plan:     I have personally reviewed and noted the following in the patient's chart:   Medical and social history Use of alcohol, tobacco or illicit drugs  Current medications and supplements including opioid prescriptions. Patient is not currently taking opioid prescriptions. Functional ability and status Nutritional status Physical activity Advanced directives List of other physicians Hospitalizations, surgeries, and ER visits in previous 12 months Vitals Screenings  to include cognitive, depression, and falls Referrals and appointments  In addition, I have reviewed and discussed with patient certain preventive protocols, quality metrics, and best practice recommendations. A written personalized care plan for preventive services as well as general preventive health recommendations were provided to patient.     Marzella Schlein, LPN   10/06/8313   After Visit Summary: (MyChart) Due to this being a telephonic visit, the after visit summary with patients personalized plan was offered to patient via MyChart   Nurse Notes: none

## 2022-12-13 NOTE — Patient Instructions (Signed)
Sheila Nguyen , Thank you for taking time to come for your Medicare Wellness Visit. I appreciate your ongoing commitment to your health goals. Please review the following plan we discussed and let me know if I can assist you in the future.   Referrals/Orders/Follow-Ups/Clinician Recommendations: work on weight loss   This is a list of the screening recommended for you and due dates:  Health Maintenance  Topic Date Due   COVID-19 Vaccine (4 - 2023-24 season) 01/05/2022   Flu Shot  12/06/2022   DTaP/Tdap/Td vaccine (3 - Td or Tdap) 05/21/2023   Medicare Annual Wellness Visit  12/13/2023   Pneumonia Vaccine  Completed   DEXA scan (bone density measurement)  Completed   Hepatitis C Screening  Completed   Zoster (Shingles) Vaccine  Completed   HPV Vaccine  Aged Out    Advanced directives: (Copy Requested) Please bring a copy of your health care power of attorney and living will to the office to be added to your chart at your convenience.  Next Medicare Annual Wellness Visit scheduled for next year: Yes  Preventive Care 79 Years and Older, Female Preventive care refers to lifestyle choices and visits with your health care provider that can promote health and wellness. What does preventive care include? A yearly physical exam. This is also called an annual well check. Dental exams once or twice a year. Routine eye exams. Ask your health care provider how often you should have your eyes checked. Personal lifestyle choices, including: Daily care of your teeth and gums. Regular physical activity. Eating a healthy diet. Avoiding tobacco and drug use. Limiting alcohol use. Practicing safe sex. Taking low-dose aspirin every day. Taking vitamin and mineral supplements as recommended by your health care provider. What happens during an annual well check? The services and screenings done by your health care provider during your annual well check will depend on your age, overall health,  lifestyle risk factors, and family history of disease. Counseling  Your health care provider may ask you questions about your: Alcohol use. Tobacco use. Drug use. Emotional well-being. Home and relationship well-being. Sexual activity. Eating habits. History of falls. Memory and ability to understand (cognition). Work and work Astronomer. Reproductive health. Screening  You may have the following tests or measurements: Height, weight, and BMI. Blood pressure. Lipid and cholesterol levels. These may be checked every 5 years, or more frequently if you are over 34 years old. Skin check. Lung cancer screening. You may have this screening every year starting at age 58 if you have a 30-pack-year history of smoking and currently smoke or have quit within the past 15 years. Fecal occult blood test (FOBT) of the stool. You may have this test every year starting at age 72. Flexible sigmoidoscopy or colonoscopy. You may have a sigmoidoscopy every 5 years or a colonoscopy every 10 years starting at age 54. Hepatitis C blood test. Hepatitis B blood test. Sexually transmitted disease (STD) testing. Diabetes screening. This is done by checking your blood sugar (glucose) after you have not eaten for a while (fasting). You may have this done every 1-3 years. Bone density scan. This is done to screen for osteoporosis. You may have this done starting at age 31. Mammogram. This may be done every 1-2 years. Talk to your health care provider about how often you should have regular mammograms. Talk with your health care provider about your test results, treatment options, and if necessary, the need for more tests. Vaccines  Your health care  provider may recommend certain vaccines, such as: Influenza vaccine. This is recommended every year. Tetanus, diphtheria, and acellular pertussis (Tdap, Td) vaccine. You may need a Td booster every 10 years. Zoster vaccine. You may need this after age  1. Pneumococcal 13-valent conjugate (PCV13) vaccine. One dose is recommended after age 80. Pneumococcal polysaccharide (PPSV23) vaccine. One dose is recommended after age 67. Talk to your health care provider about which screenings and vaccines you need and how often you need them. This information is not intended to replace advice given to you by your health care provider. Make sure you discuss any questions you have with your health care provider. Document Released: 05/20/2015 Document Revised: 01/11/2016 Document Reviewed: 02/22/2015 Elsevier Interactive Patient Education  2017 [employer].  Fall Prevention in the Home Falls can cause injuries. They can happen to people of all ages. There are many things you can do to make your home safe and to help prevent falls. What can I do on the outside of my home? Regularly fix the edges of walkways and driveways and fix any cracks. Remove anything that might make you trip as you walk through a door, such as a raised step or threshold. Trim any bushes or trees on the path to your home. Use bright outdoor lighting. Clear any walking paths of anything that might make someone trip, such as rocks or tools. Regularly check to see if handrails are loose or broken. Make sure that both sides of any steps have handrails. Any raised decks and porches should have guardrails on the edges. Have any leaves, snow, or ice cleared regularly. Use sand or salt on walking paths during winter. Clean up any spills in your garage right away. This includes oil or grease spills. What can I do in the bathroom? Use night lights. Install grab bars by the toilet and in the tub and shower. Do not use towel bars as grab bars. Use non-skid mats or decals in the tub or shower. If you need to sit down in the shower, use a plastic, non-slip stool. Keep the floor dry. Clean up any water that spills on the floor as soon as it happens. Remove soap buildup in the tub or shower  regularly. Attach bath mats securely with double-sided non-slip rug tape. Do not have throw rugs and other things on the floor that can make you trip. What can I do in the bedroom? Use night lights. Make sure that you have a light by your bed that is easy to reach. Do not use any sheets or blankets that are too big for your bed. They should not hang down onto the floor. Have a firm chair that has side arms. You can use this for support while you get dressed. Do not have throw rugs and other things on the floor that can make you trip. What can I do in the kitchen? Clean up any spills right away. Avoid walking on wet floors. Keep items that you use a lot in easy-to-reach places. If you need to reach something above you, use a strong step stool that has a grab bar. Keep electrical cords out of the way. Do not use floor polish or wax that makes floors slippery. If you must use wax, use non-skid floor wax. Do not have throw rugs and other things on the floor that can make you trip. What can I do with my stairs? Do not leave any items on the stairs. Make sure that there are handrails on both  sides of the stairs and use them. Fix handrails that are broken or loose. Make sure that handrails are as long as the stairways. Check any carpeting to make sure that it is firmly attached to the stairs. Fix any carpet that is loose or worn. Avoid having throw rugs at the top or bottom of the stairs. If you do have throw rugs, attach them to the floor with carpet tape. Make sure that you have a light switch at the top of the stairs and the bottom of the stairs. If you do not have them, ask someone to add them for you. What else can I do to help prevent falls? Wear shoes that: Do not have high heels. Have rubber bottoms. Are comfortable and fit you well. Are closed at the toe. Do not wear sandals. If you use a stepladder: Make sure that it is fully opened. Do not climb a closed stepladder. Make sure that  both sides of the stepladder are locked into place. Ask someone to hold it for you, if possible. Clearly mark and make sure that you can see: Any grab bars or handrails. First and last steps. Where the edge of each step is. Use tools that help you move around (mobility aids) if they are needed. These include: Canes. Walkers. Scooters. Crutches. Turn on the lights when you go into a dark area. Replace any light bulbs as soon as they burn out. Set up your furniture so you have a clear path. Avoid moving your furniture around. If any of your floors are uneven, fix them. If there are any pets around you, be aware of where they are. Review your medicines with your doctor. Some medicines can make you feel dizzy. This can increase your chance of falling. Ask your doctor what other things that you can do to help prevent falls. This information is not intended to replace advice given to you by your health care provider. Make sure you discuss any questions you have with your health care provider. Document Released: 02/17/2009 Document Revised: 09/29/2015 Document Reviewed: 05/28/2014 Elsevier Interactive Patient Education  2017 [employer].

## 2022-12-19 ENCOUNTER — Other Ambulatory Visit: Payer: Self-pay | Admitting: Family Medicine

## 2022-12-20 ENCOUNTER — Other Ambulatory Visit: Payer: Self-pay | Admitting: Family Medicine

## 2022-12-28 ENCOUNTER — Other Ambulatory Visit (INDEPENDENT_AMBULATORY_CARE_PROVIDER_SITE_OTHER): Payer: Medicare Other

## 2022-12-28 DIAGNOSIS — I10 Essential (primary) hypertension: Secondary | ICD-10-CM | POA: Diagnosis not present

## 2022-12-28 DIAGNOSIS — M8580 Other specified disorders of bone density and structure, unspecified site: Secondary | ICD-10-CM | POA: Diagnosis not present

## 2022-12-28 LAB — LIPID PANEL
Cholesterol: 137 mg/dL (ref 0–200)
HDL: 39.1 mg/dL (ref >39.00)
LDL Cholesterol: 72 mg/dL (ref 0–99)
NonHDL: 98.08
Total CHOL/HDL Ratio: 4
Triglycerides: 132 mg/dL (ref 0.0–149.0)
VLDL: 26.4 mg/dL (ref 0.0–40.0)

## 2022-12-28 LAB — CBC WITH DIFFERENTIAL/PLATELET
Basophils Absolute: 0 10*3/uL (ref 0.0–0.1)
Basophils Relative: 0.8 % (ref 0.0–3.0)
Eosinophils Absolute: 0.1 10*3/uL (ref 0.0–0.7)
Eosinophils Relative: 2 % (ref 0.0–5.0)
HCT: 43.5 % (ref 36.0–46.0)
Hemoglobin: 14.2 g/dL (ref 12.0–15.0)
Lymphocytes Relative: 30.8 % (ref 12.0–46.0)
Lymphs Abs: 1.8 10*3/uL (ref 0.7–4.0)
MCHC: 32.6 g/dL (ref 30.0–36.0)
MCV: 90.4 fl (ref 78.0–100.0)
Monocytes Absolute: 0.7 10*3/uL (ref 0.1–1.0)
Monocytes Relative: 11.3 % (ref 3.0–12.0)
Neutro Abs: 3.2 10*3/uL (ref 1.4–7.7)
Neutrophils Relative %: 55.1 % (ref 43.0–77.0)
Platelets: 253 10*3/uL (ref 150.0–400.0)
RBC: 4.81 Mil/uL (ref 3.87–5.11)
RDW: 13.9 % (ref 11.5–15.5)
WBC: 5.8 10*3/uL (ref 4.0–10.5)

## 2022-12-28 LAB — COMPREHENSIVE METABOLIC PANEL
ALT: 19 U/L (ref 0–35)
AST: 16 U/L (ref 0–37)
Albumin: 4.2 g/dL (ref 3.5–5.2)
Alkaline Phosphatase: 161 U/L — ABNORMAL HIGH (ref 39–117)
BUN: 14 mg/dL (ref 6–23)
CO2: 26 mEq/L (ref 19–32)
Calcium: 9.4 mg/dL (ref 8.4–10.5)
Chloride: 104 mEq/L (ref 96–112)
Creatinine, Ser: 0.9 mg/dL (ref 0.40–1.20)
GFR: 60.83 mL/min (ref >60.00)
Glucose, Bld: 114 mg/dL — ABNORMAL HIGH (ref 70–99)
Potassium: 4 mEq/L (ref 3.5–5.1)
Sodium: 140 mEq/L (ref 135–145)
Total Bilirubin: 0.6 mg/dL (ref 0.2–1.2)
Total Protein: 7.4 g/dL (ref 6.0–8.3)

## 2022-12-28 LAB — VITAMIN D 25 HYDROXY (VIT D DEFICIENCY, FRACTURES): VITD: 68.22 ng/mL (ref 30.00–100.00)

## 2023-01-03 ENCOUNTER — Ambulatory Visit (INDEPENDENT_AMBULATORY_CARE_PROVIDER_SITE_OTHER): Payer: Medicare Other | Admitting: Family Medicine

## 2023-01-03 ENCOUNTER — Encounter: Payer: Self-pay | Admitting: Family Medicine

## 2023-01-03 ENCOUNTER — Telehealth: Payer: Self-pay | Admitting: Family Medicine

## 2023-01-03 VITALS — BP 128/78 | HR 83 | Temp 97.1°F | Ht 63.0 in | Wt 162.0 lb

## 2023-01-03 DIAGNOSIS — I10 Essential (primary) hypertension: Secondary | ICD-10-CM

## 2023-01-03 DIAGNOSIS — E7800 Pure hypercholesterolemia, unspecified: Secondary | ICD-10-CM

## 2023-01-03 DIAGNOSIS — Z0000 Encounter for general adult medical examination without abnormal findings: Secondary | ICD-10-CM

## 2023-01-03 DIAGNOSIS — F419 Anxiety disorder, unspecified: Secondary | ICD-10-CM

## 2023-01-03 DIAGNOSIS — Z170 Estrogen receptor positive status [ER+]: Secondary | ICD-10-CM

## 2023-01-03 DIAGNOSIS — Z7189 Other specified counseling: Secondary | ICD-10-CM

## 2023-01-03 DIAGNOSIS — R748 Abnormal levels of other serum enzymes: Secondary | ICD-10-CM

## 2023-01-03 MED ORDER — CITALOPRAM HYDROBROMIDE 10 MG PO TABS: 10.0000 mg | ORAL_TABLET | Freq: Every day | ORAL | 3 refills | Status: DC

## 2023-01-03 MED ORDER — ALPRAZOLAM 0.25 MG PO TABS: ORAL_TABLET | ORAL | 1 refills | Status: DC

## 2023-01-03 MED ORDER — BUPROPION HCL ER (SR) 100 MG PO TB12: ORAL_TABLET | ORAL | 3 refills | Status: DC

## 2023-01-03 MED ORDER — PRAVASTATIN SODIUM 40 MG PO TABS: ORAL_TABLET | ORAL | 3 refills | Status: DC

## 2023-01-03 NOTE — Telephone Encounter (Signed)
Called Rx into pharmacy.  

## 2023-01-03 NOTE — Patient Instructions (Signed)
Take care.  Glad to see you. Update me as needed.  

## 2023-01-03 NOTE — Telephone Encounter (Addendum)
Please call in olmesartan 10mg  per day.  #[phone], [phone].   [id number] - [place], [place] - [address] AT [facility] OF [address] [phone]   I can't send via EMR.  Thanks.

## 2023-01-03 NOTE — Progress Notes (Signed)
Flu due this fall.   Shingles 2021 PNA previously done Tetanus 2015 COVID-vaccine previously done RSV vaccine d/w pt.   Colon cancer screening declined by patient. Breast cancer screening 2024 Bone density test 2024 Advance directive-son designated if patient were incapacitated.  Her best friend died this year.  This is a big adjustment for patient.  D/w pt.    Mood is good with citalopram and wellbutrin.  She has used xanax with flying.  D/w pt about use, she is flying to [place] again this year.     Hypertension:               Using medication without problems or lightheadedness:  yes Chest pain with exertion:no Edema:no Short of breath:no Higher BP at home on her cuff.   BP up to 140s at home but can be lower.  BP improved after she resigned from [employer].   Labs d/w pt.     Elevated Cholesterol: Using medications without problems:yes Muscle aches: no Diet compliance: d/w pt.  Exercise: d/w pt.     Alk phos elevation d/w pt.  Prev bone scan with IMPRESSION: No evidence of osseous metastatic disease.   Still on anastrozole.  Compliant. Hot flashes improved.  She has some sleep changes, possibly med related.    Knee pain improved on taking extra vitamin D.  Was prev taking 2000 vs 5000 units.  D/w pt about options, ie continue with ~2000 units daily and update me as needed.    Meds, vitals, and allergies reviewed.   PMH and SH reviewed  ROS: Per HPI unless specifically indicated in ROS section   GEN: nad, alert and oriented HEENT: mucous membranes moist NECK: supple w/o LA CV: rrr. PULM: ctab, no inc wob ABD: soft, +bs EXT: no edema SKIN: no acute rash

## 2023-01-07 DIAGNOSIS — Z0000 Encounter for general adult medical examination without abnormal findings: Secondary | ICD-10-CM | POA: Insufficient documentation

## 2023-01-07 NOTE — Assessment & Plan Note (Signed)
Similar to previous with prior workup discussed with patient and without any alarming symptoms.  No right upper quadrant pain.

## 2023-01-07 NOTE — Assessment & Plan Note (Signed)
Discussed diet and exercise.  Continue pravastatin.

## 2023-01-07 NOTE — Assessment & Plan Note (Signed)
Higher BP at home on her cuff.   BP up to 140s at home but can be lower.  BP improved after she resigned from [employer].  She can monitor her blood pressure but in the meantime I would continue olmesartan 10 mg as is.

## 2023-01-07 NOTE — Assessment & Plan Note (Signed)
Flu due this fall.   Shingles 2021 PNA previously done Tetanus 2015 COVID-vaccine previously done RSV vaccine d/w pt.   Colon cancer screening declined by patient. Breast cancer screening 2024 Bone density test 2024 Advance directive-son designated if patient were incapacitated.

## 2023-01-07 NOTE — Assessment & Plan Note (Signed)
History of.Compliant with anastrozole. Hot flashes improved.  She has some sleep changes, possibly med related.  Would still continue as is.

## 2023-01-07 NOTE — Assessment & Plan Note (Signed)
Mood is good with citalopram and wellbutrin.  She has used xanax with flying.  D/w pt about use, she is flying to [place] again this year.   Will continue those meds as is.

## 2023-02-03 ENCOUNTER — Other Ambulatory Visit: Payer: Self-pay | Admitting: Hematology and Oncology

## 2023-07-05 DIAGNOSIS — H353131 Nonexudative age-related macular degeneration, bilateral, early dry stage: Secondary | ICD-10-CM | POA: Diagnosis not present

## 2023-07-05 DIAGNOSIS — H25813 Combined forms of age-related cataract, bilateral: Secondary | ICD-10-CM | POA: Diagnosis not present

## 2023-07-05 DIAGNOSIS — H524 Presbyopia: Secondary | ICD-10-CM | POA: Diagnosis not present

## 2023-07-05 DIAGNOSIS — H52223 Regular astigmatism, bilateral: Secondary | ICD-10-CM | POA: Diagnosis not present

## 2023-07-26 ENCOUNTER — Encounter: Payer: Self-pay | Admitting: Family Medicine

## 2023-11-25 ENCOUNTER — Ambulatory Visit: Payer: Medicare Other | Admitting: Hematology and Oncology

## 2023-12-02 DIAGNOSIS — Z1231 Encounter for screening mammogram for malignant neoplasm of breast: Secondary | ICD-10-CM | POA: Diagnosis not present

## 2023-12-05 ENCOUNTER — Encounter: Payer: Self-pay | Admitting: Hematology and Oncology

## 2023-12-09 ENCOUNTER — Telehealth: Payer: Self-pay

## 2023-12-09 NOTE — Telephone Encounter (Signed)
 Pt verbally confirmed appt for 8/5

## 2023-12-10 ENCOUNTER — Inpatient Hospital Stay: Attending: Hematology and Oncology | Admitting: Hematology and Oncology

## 2023-12-10 VITALS — BP 147/64 | HR 81 | Temp 97.7°F | Resp 18 | Ht 63.0 in | Wt 164.1 lb

## 2023-12-10 DIAGNOSIS — Z79811 Long term (current) use of aromatase inhibitors: Secondary | ICD-10-CM | POA: Insufficient documentation

## 2023-12-10 DIAGNOSIS — I10 Essential (primary) hypertension: Secondary | ICD-10-CM | POA: Insufficient documentation

## 2023-12-10 DIAGNOSIS — Z170 Estrogen receptor positive status [ER+]: Secondary | ICD-10-CM | POA: Diagnosis not present

## 2023-12-10 DIAGNOSIS — Z79899 Other long term (current) drug therapy: Secondary | ICD-10-CM | POA: Diagnosis not present

## 2023-12-10 DIAGNOSIS — C50411 Malignant neoplasm of upper-outer quadrant of right female breast: Secondary | ICD-10-CM | POA: Diagnosis not present

## 2023-12-10 DIAGNOSIS — C50412 Malignant neoplasm of upper-outer quadrant of left female breast: Secondary | ICD-10-CM | POA: Insufficient documentation

## 2023-12-10 DIAGNOSIS — Z7982 Long term (current) use of aspirin: Secondary | ICD-10-CM | POA: Diagnosis not present

## 2023-12-10 DIAGNOSIS — M8580 Other specified disorders of bone density and structure, unspecified site: Secondary | ICD-10-CM | POA: Insufficient documentation

## 2023-12-10 DIAGNOSIS — Z923 Personal history of irradiation: Secondary | ICD-10-CM | POA: Insufficient documentation

## 2023-12-10 DIAGNOSIS — R232 Flushing: Secondary | ICD-10-CM | POA: Insufficient documentation

## 2023-12-10 NOTE — Progress Notes (Signed)
 [facility]  [phone] [phone]     ID: Sheila Nguyen DOB: March 17, 1946  MR#: [id number]  RDW#:[id number]  Patient Care Team: Cleatus Arlyss RAMAN, MD as PCP - General (Family Medicine) Glean Stephane BROCKS, RN (Inactive) as Oncology Nurse Navigator Tyree Nanetta SAILOR, RN as Oncology Nurse Navigator Vernetta Berg, MD as Consulting Physician (General Surgery) Lanny Callander, MD as Consulting Physician (Hematology) Dewey Rush, MD as Consulting Physician (Radiation Oncology) Amber Stalls, MD  CHIEF COMPLAINT: Estrogen receptor positive breast cancer  CURRENT TREATMENT: Anastrozole   INTERVAL HISTORY:  Discussed the use of AI scribe software for clinical note transcription with the patient, who gave verbal consent to proceed.  History of Present Illness Sheila Nguyen is an 78 year old female with hypertension and osteopenia who presents for follow-up regarding her medication management and blood pressure control.  She has been on anastrozole  for about a year and a half, starting in February of the previous year. She experiences hot flashes, though they are less severe than at the beginning of her treatment. She is concerned that anastrozole  may be contributing to her elevated blood pressure, which has been higher than usual over the past year, as measured at home.  She is currently taking 10 mg of a blood pressure medication, but her blood pressure remains elevated. She has a history of passing out, which she attributes to factors other than low blood pressure, such as reactions to vaccinations like the shingles shot.  She is managing osteopenia and is taking vitamin D  supplements. She has not had a bone density test this year, as it is scheduled every other year.  She recently underwent a mammogram, which was normal, and she is scheduled to return for another in one year. She is preparing for cataract surgery and inquired about any potential interactions  with anastrozole , which were clarified as non-existent.  Rest of the pertinent 10 point ROS reviewed and negative   COVID 19 VACCINATION STATUS: Moderna x2, with booster September 2021   HISTORY OF CURRENT ILLNESS: From a prior intake note:  Sheila Nguyen had routine screening mammography on 12/02/2019 showing a possible abnormality in the bilateral breasts. She underwent bilateral diagnostic mammography with tomography and bilateral breast ultrasonography at [facility] on 12/14/2019 showing: breast density category B; 3 mm irregular mass in right breast at 9:30; cysts in left breast at 12 o'clock on ultrasound have irregular margins on mammogram; benign-appearing lymph nodes in right axilla.  Accordingly on 01/06/2020 she proceeded to biopsy of the bilateral breast areas in question. The pathology from this procedure ([id number]) showed:  1. 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 Proliferation marker Ki67 at 1%. HER2 negative by immunohistochemistry (1+). 2. Right Breast  - invasive ductal carcinoma, grade 1  - ductal carcinoma in situ  - Prognostic indicators significant for: estrogen receptor, 80% positive and progesterone receptor, 10% positive, both with strong staining intensity. Proliferation marker Ki67 at 1%. HER2 negative by immunohistochemistry (1+).  She underwent bilateral lumpectomies on 02/11/2020 under Dr. Vernetta. Pathology from the procedure [id number]) showed: 1. Left Breast  - ductal carcinoma in situ, intermediate grade  - columnar cell and fibrocystic changes with apocrine metaplasia  - focal atypical ductal hyperplasia  - lobular neoplasia   - no residual carcinoma 2. Right Breast  - ductal carcinoma in situ, intermediate  grade  - adenosis, columnar cell, and fibrocystic  changes  - no residual carcinoma  She subsequently received radiation therapy from 04/13/2020 through 05/13/2020 under Dr. Dewey.   Cancer Staging  Malignant neoplasm of upper-outer quadrant of left breast in female, estrogen receptor positive (HCC) Staging form: Breast, AJCC 8th Edition - Clinical stage from 01/13/2020: Stage IA (cT1b, cN0, cM0, G1, ER+, PR-, HER2-) - Signed by Layla Sandria BROCKS, MD on 06/02/2020 Stage prefix: Initial diagnosis Method of lymph node assessment: Clinical Histologic grading system: 3 grade system  Malignant neoplasm of upper-outer quadrant of right breast in female, estrogen receptor positive (HCC) Staging form: Breast, AJCC 8th Edition - Clinical stage from 01/13/2020: Stage IA (cT1a, cN0, cM0, G1, ER+, PR+, HER2-) - Unsigned Stage prefix: Initial diagnosis Method of lymph node assessment: Clinical Histologic grading system: 3 grade system   The patient's subsequent history is as detailed below.   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  The patient's mother died at age 43.  The patient has 1 brother, no sisters.  She is not aware of any cancer history in  the family   GYNECOLOGIC HISTORY:  No LMP recorded. Patient is postmenopausal. Menarche: 78 years old Age at first live birth: 78 years old GX P 1 (+1 adopted child). LMP in her late 73's Contraceptive: used for 3 years HRT yes  Hysterectomy? no BSO? no   SOCIAL HISTORY: (updated January 2022 Scotti is originally from [place].  She moved to [place] approximately age 29.  She worked as a Lawyer.  Her husband died from urothelial cancer in the setting of tobacco abuse.  She lives by herself.  Patient's adopted daughter Luke is a International aid/development worker in [place], [place].  The patient's son Redell works for [employer] in [place].   The patient has no grandchildren.  She is a International aid/development worker    ADVANCED DIRECTIVES:    HEALTH MAINTENANCE: Social History   Tobacco Use   Smoking status: Never   Smokeless tobacco: Never  Substance Use Topics   Alcohol use: No    Alcohol/week: 0.0 standard drinks of alcohol   Drug use: No     Colonoscopy: Refuses  PAP: Remote  Bone density: 09/2017   Allergies  Allergen Reactions   Other     Almond, lemon, chocolate, barley   Amoxicillin Rash    Flushed face/fevery   Wellbutrin  [Bupropion ] Other (See Comments)    Max tolerated dose is 100mg  daily.  Irritable on higher dose.     Current Outpatient Medications  Medication Sig Dispense Refill   ALPRAZolam  (XANAX ) 0.25  MG tablet TAKE ONE TABLET BY MOUTH DAILY AS NEEDED FOR ANXIETY. MAY CAUSE DROWSINESS 10 tablet 1   anastrozole  (ARIMIDEX ) 1 MG tablet TAKE 1 TABLET(1 MG) BY MOUTH DAILY. START 06/07/2020 90 tablet 4   aspirin EC 81 MG tablet Take 81 mg by mouth daily. Swallow whole.     buPROPion  ER (WELLBUTRIN  SR) 100 MG 12 hr tablet TAKE 1 TABLET(100 MG) BY MOUTH DAILY 90 tablet 3   Cholecalciferol (VITAMIN D ) 50 MCG (2000 UT) tablet Take 2,000 Units by mouth daily.     citalopram  (CELEXA ) 10 MG tablet Take 1 tablet (10 mg total) by mouth daily. 90 tablet 3   FIBER FORMULA PO Take 1 capsule by  mouth 2 (two) times daily.     Olmesartan  Medoxomil (BENICAR  PO) Take 10 mg by mouth daily. Take one tablet by mouth once a day     pravastatin  (PRAVACHOL ) 40 MG tablet TAKE 1 TABLET BY MOUTH AT NIGHT 90 tablet 3   No current facility-administered medications for this visit.    OBJECTIVE: White woman who appears younger than stated age  Vitals:   12/10/23 1352 12/10/23 1353  BP: (!) 202/76 (!) 174/72  Pulse: 81   Resp: 18   Temp: 97.7 F (36.5 C)   SpO2: 99%      Body mass index is 29.07 kg/m.   Wt Readings from Last 3 Encounters:  12/10/23 164 lb 1.6 oz (74.4 kg)  01/03/23 162 lb (73.5 kg)  12/13/22 161 lb (73 kg)    ECOG FS:1 - Symptomatic but completely ambulatory  Physical Exam Constitutional:      Appearance: Normal appearance.  Chest:     Comments: Bilateral breasts inspected.  No palpable masses or regional adenopathy Musculoskeletal:        General: No swelling.     Cervical back: Normal range of motion and neck supple. No rigidity.  Lymphadenopathy:     Cervical: No cervical adenopathy.  Neurological:     Mental Status: She is alert.       LAB RESULTS:  CMP     Component Value Date/Time   NA 140 12/28/2022 0819   K 4.0 12/28/2022 0819   CL 104 12/28/2022 0819   CO2 26 12/28/2022 0819   GLUCOSE 114 (H) 12/28/2022 0819   BUN 14 12/28/2022 0819   CREATININE 0.90 12/28/2022 0819   CREATININE 0.90 01/13/2020 1218   CALCIUM 9.4 12/28/2022 0819   PROT 7.4 12/28/2022 0819   ALBUMIN 4.2 12/28/2022 0819   AST 16 12/28/2022 0819   AST 17 01/13/2020 1218   ALT 19 12/28/2022 0819   ALT 25 01/13/2020 1218   ALKPHOS 161 (H) 12/28/2022 0819   BILITOT 0.6 12/28/2022 0819   BILITOT 0.4 01/13/2020 1218   GFRNONAA >60 01/13/2020 1218   GFRAA >60 01/13/2020 1218    No results found for: TOTALPROTELP, ALBUMINELP, A1GS, A2GS, BETS, BETA2SER, GAMS, MSPIKE, SPEI  Lab Results  Component Value Date   WBC 5.8 12/28/2022   NEUTROABS 3.2  12/28/2022   HGB 14.2 12/28/2022   HCT 43.5 12/28/2022   MCV 90.4 12/28/2022   PLT 253.0 12/28/2022    No results found for: LABCA2  No components found for: [id number]  No results for input(s): INR in the last 168 hours.  No results found for: LABCA2  No results found for: RJW800  No results found for: CAN125  No results found for: CAN153  No results found for: CA2729  No components found for: HGQUANT  No results found for: CEA1, CEA / No results found for: CEA1, CEA   No results found for: AFPTUMOR  No results found for: CHROMOGRNA  No results found for: KPAFRELGTCHN, LAMBDASER, KAPLAMBRATIO (kappa/lambda light chains)  No results found for: HGBA, HGBA2QUANT, HGBFQUANT, HGBSQUAN (Hemoglobinopathy evaluation)   No results found for: LDH  No results found for: IRON, TIBC, IRONPCTSAT (Iron and TIBC)  No results found for: FERRITIN  Urinalysis    Component Value Date/Time   COLORURINE YELLOW 05/18/2013 1037   APPEARANCEUR CLEAR 05/18/2013 1037   LABSPEC 1.006 05/18/2013 1037   PHURINE 7.0 05/18/2013 1037   GLUCOSEU NEG 05/18/2013 1037   HGBUR NEG 05/18/2013 1037   BILIRUBINUR NEG 05/18/2013 1037   KETONESUR NEG 05/18/2013 1037   PROTEINUR NEG 05/18/2013 1037   UROBILINOGEN 0.2 05/18/2013 1037   NITRITE NEG 05/18/2013 1037   LEUKOCYTESUR MOD (A) 05/18/2013 1037     STUDIES: AL: Z85.3 - Personal History Of Breast Cancer Annual Mammogram. Personal history of bilateral breast cancer in 02/2020 with bilateral lumpectomies.  Digital breast tomosynthesis imaging has been obtained as part of this examination. Comparison is made to exam(s) dated: 12/02/2019, 12/14/2019. Breast Composition Category B: The breast tissue is composed of scattered fibroglandular density.  Current study was also evaluated with a Computer Aided Detection (CAD) system. Post surgical distortion and density at the superior left  lumpectomy site are unchanged. New post surgical architectural distortion in the lateral right breast, posterior depth consistent with interval lumpectomy. No significant masses, calcifications, or other findings are seen in either breast.  IMPRESSION: BENIGN Stable left lumpectomy. New post treatment changes of the right breast. There is no mammographic evidence of malignancy. A follow-up mammogram in 12 months is recommended.(11/01/2021)   This exam was interpreted at the [facility] location.  Dorothe Ruth M.D. sl/penrad:11/01/2020 10:50:44   Letter: Verbal- These findings were given to the patient verbally and in writing at the time of examination. BI-RADS Category 2: Benign   Electronically Signed By: Dorothe Ruth MD   ELIGIBLE FOR AVAILABLE RESEARCH PROTOCOL: no  ASSESSMENT: 78 y.o. [place] woman status post bilateral breast biopsies 01/06/2020, showing  (1) on the right, an invasive ductal carcinoma, grade 1, measuring 0.4 cm (T1a), estrogen and progesterone receptor positive, with an MIB-1 of 1% and HER-2 not amplified  (2 on the left, and invasive ductal carcinoma, grade 1, measuring 1.0 cm. (T1b), estrogen receptor positive, progesterone receptor negative, with an MIB-1 of 1%.  HER-2 was not amplified  (3) status post bilateral lumpectomies 02/11/2020 showing bilateral ductal carcinoma in situ, both grade 2, with no evidence of invasive disease, and negative margins  (4) adjuvant radiation 04/13/20 - 05/13/20  Site/dose:   The patient initially received a dose of 42.56 Gy in 16 fractions to the bilateral breasts using whole-breast tangent fields. This was delivered using a 3-D conformal technique. The patient then received a boost to each seroma. This delivered an additional 8 Gy in 4 fractions using a 3 field photon technique on each side due to the depth of the seroma. The total dose to each breast was 50.56 Gy.  (5) anastrozole  started 06/02/2020  (a) [facility] density at [facility]  01/27/2020 showed a T score of -1.5   PLAN:  Assessment and Plan Assessment & Plan Breast cancer on anastrozole  therapy On anastrozole  for 18 months with reduced hot flashes. - Continue anastrozole  therapy. - Reassured anastrozole  does not interfere with cataract surgery.  Hypertension Current antihypertensive dose insufficient. Elevated home readings. - Increase  antihypertensive to 15 mg daily. - Monitor blood pressure at home, morning and evening. - Maintain blood pressure log for primary care discussion.  Osteopenia Taking vitamin D  but lacks strength training or weight-bearing exercises. . - Encourage 30 minutes of daily weight-bearing exercise. - Continue vitamin D  supplementation.   Total time spent: 30 minutes  *Total Encounter Time as defined by the Centers for Medicare and Medicaid Services includes, in addition to the face-to-face time of a patient visit (documented in the note above) non-face-to-face time: obtaining and reviewing outside history, ordering and reviewing medications, tests or procedures, care coordination (communications with other health care professionals or caregivers) and documentation in the medical record.

## 2023-12-18 ENCOUNTER — Other Ambulatory Visit: Payer: Self-pay | Admitting: Family Medicine

## 2023-12-18 DIAGNOSIS — M8580 Other specified disorders of bone density and structure, unspecified site: Secondary | ICD-10-CM

## 2023-12-18 DIAGNOSIS — I10 Essential (primary) hypertension: Secondary | ICD-10-CM

## 2023-12-18 NOTE — Telephone Encounter (Signed)
 This request came in for the 5mg  tabs, so I sent it with the sig to take two per day.  Let me know if she can't get it filled.  Needs yearly OV when possible.   Thanks.

## 2023-12-18 NOTE — Telephone Encounter (Signed)
 LOV: 01/03/23 WNC:[id number] scheduled  Last Refill: Olmesartan  Medoxomil (BENICAR  PO) 10/15/22

## 2023-12-19 ENCOUNTER — Ambulatory Visit (INDEPENDENT_AMBULATORY_CARE_PROVIDER_SITE_OTHER): Payer: Medicare Other

## 2023-12-19 VITALS — Ht 63.0 in | Wt 158.0 lb

## 2023-12-19 DIAGNOSIS — Z0000 Encounter for general adult medical examination without abnormal findings: Secondary | ICD-10-CM

## 2023-12-19 NOTE — Telephone Encounter (Signed)
 Please call patient to schedule her for a yearly office visit. Thank you

## 2023-12-19 NOTE — Telephone Encounter (Signed)
 Spoke to pt, cpe was sch with Nurse Health Advisor, Proctor, during [facility] telephone call today. Sch cpe for 02/07/24. Pt requested her lab orders be sent to the [facility] on [address]?

## 2023-12-19 NOTE — Progress Notes (Signed)
 Subjective:   Sheila Nguyen is a 78 y.o. who presents for a Medicare Wellness preventive visit.  As a reminder, Annual Wellness Visits don't include a physical exam, and some assessments may be limited, especially if this visit is performed virtually. We may recommend an in-person follow-up visit with your provider if needed.  Visit Complete: Virtual I connected with  Sheila Nguyen on 12/19/23 by a audio enabled telemedicine application and verified that I am speaking with the correct person using two identifiers.  Patient Location: Home  Provider Location: Home Office  I discussed the limitations of evaluation and management by telemedicine. The patient expressed understanding and agreed to proceed.  Vital Signs: Because this visit was a virtual/telehealth visit, some criteria may be missing or patient reported. Any vitals not documented were not able to be obtained and vitals that have been documented are patient reported.  VideoDeclined- This patient declined Librarian, academic. Therefore the visit was completed with audio only.  Persons Participating in Visit: Patient.  AWV Questionnaire: No: Patient Medicare AWV questionnaire was not completed prior to this visit.  Cardiac Risk Factors include: advanced age (>1men, >53 women);dyslipidemia;hypertension     Objective:    Today's Vitals   12/19/23 1104  Weight: 158 lb (71.7 kg)  Height: 5' 3 (1.6 m)   Body mass index is 27.99 kg/m.     12/19/2023   11:14 AM 12/13/2022   11:37 AM 03/15/2020   10:05 AM 02/11/2020    9:53 AM 02/08/2020   12:35 PM  Advanced Directives  Does Patient Have a Medical Advance Directive? Yes Yes Yes No No  Type of Estate agent of [place];Living will Healthcare Power of [place];Living will Living will    Does patient want to make changes to medical advance directive?   No - Patient declined    Copy of Healthcare Power of Attorney in Chart?  No - copy requested No - copy requested     Would patient like information on creating a medical advance directive?    No - Patient declined No - Patient declined    Current Medications (verified) Outpatient Encounter Medications as of 12/19/2023  Medication Sig   ALPRAZolam  (XANAX ) 0.25 MG tablet TAKE ONE TABLET BY MOUTH DAILY AS NEEDED FOR ANXIETY. MAY CAUSE DROWSINESS   anastrozole  (ARIMIDEX ) 1 MG tablet TAKE 1 TABLET(1 MG) BY MOUTH DAILY. START 06/07/2020   aspirin EC 81 MG tablet Take 81 mg by mouth daily. Swallow whole.   buPROPion  ER (WELLBUTRIN  SR) 100 MG 12 hr tablet TAKE 1 TABLET(100 MG) BY MOUTH DAILY   Cholecalciferol (VITAMIN D ) 50 MCG (2000 UT) tablet Take 2,000 Units by mouth daily.   citalopram  (CELEXA ) 10 MG tablet Take 1 tablet (10 mg total) by mouth daily.   FIBER FORMULA PO Take 1 capsule by mouth 2 (two) times daily.   olmesartan  (BENICAR ) 5 MG tablet TAKE 2 TABLETS BY MOUTH DAILY (Patient taking differently: Take 15 mg by mouth daily.)   Olmesartan  Medoxomil (BENICAR  PO) Take 10 mg by mouth daily. Take one tablet by mouth once a day   pravastatin  (PRAVACHOL ) 40 MG tablet TAKE 1 TABLET BY MOUTH AT NIGHT   No facility-administered encounter medications on file as of 12/19/2023.    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   BREAST LUMPECTOMY WITH RADIOACTIVE SEED LOCALIZATION Bilateral 02/11/2020   Procedure: BILATERAL BREAST LUMPECTOMY WITH RADIOACTIVE SEED LOCALIZATIONS;  Surgeon: Vernetta Berg, MD;  Location: [facility];  Service: General;  Laterality: Bilateral;   BREAST  SURGERY     CESAREAN SECTION  05/07/1977   Family History  Problem Relation Age of Onset   Alcohol abuse Father    Stroke Father    Anxiety disorder Mother    Colon cancer Neg Hx    Breast cancer Neg Hx    Social History   Socioeconomic History   Marital status: Widowed    Spouse name: Not on file   Number of children: 2   Years of education: Not on file   Highest education level: Not on file  Occupational History   Occupation: Lawyer    Employer: [employer]    Comment: BA  Psychology  Tobacco Use   Smoking status: Never   Smokeless tobacco: Never  Substance and Sexual Activity   Alcohol use: No    Alcohol/week: 0.0 standard drinks of alcohol   Drug use: No   Sexual activity: Not Currently    Birth control/protection: Post-menopausal  Other Topics Concern   Not on file  Social History Narrative   Widowed 06/2017.  Was prev divorced from first husband then had remarried longtime boyfriend.     From [place]   To [place] 2006   Retired Academic librarian from [place], Psychology degree from [employer]   Her kids are out of state.     Social Drivers of Corporate investment banker Strain: Low Risk  (12/19/2023)   Overall Financial Resource Strain (CARDIA)    Difficulty of Paying Living Expenses: Not hard at all  Food Insecurity: No Food Insecurity (12/19/2023)   Hunger Vital Sign    Worried About Running Out of Food in the Last Year: Never true    Ran Out of Food in the Last Year: Never true  Transportation Needs: No Transportation Needs (12/19/2023)   PRAPARE - Administrator, Civil Service (Medical): No    Lack of Transportation (Non-Medical): No  Physical Activity: Insufficiently Active (12/19/2023)   Exercise Vital Sign    Days of Exercise per Week: 3 days    Minutes of Exercise per Session: 30 min  Stress: No Stress Concern Present (12/19/2023)   [employer] of Occupational Health - Occupational Stress  Questionnaire    Feeling of Stress: Only a little  Social Connections: Moderately Isolated (12/19/2023)   Social Connection and Isolation Panel    Frequency of Communication with Friends and Family: More than three times a week    Frequency of Social Gatherings with Friends and Family: More than three times a week    Attends Religious Services: Never    Database administrator or Organizations: No    Attends Engineer, structural: Never    Marital Status: Married    Tobacco Counseling Counseling given: Not Answered    Clinical Intake:  Pre-visit preparation completed: Yes  Pain : No/denies pain     BMI - recorded: 27.99 Nutritional Status: BMI 25 -29 Overweight Nutritional Risks: Nausea/ vomitting/ diarrhea (  last week had norovirus 2 days) Diabetes: No  No results found for: HGBA1C   How often do you need to have someone help you when you read instructions, pamphlets, or other written materials from your doctor or pharmacy?: 1 - Never  Interpreter Needed?: No  Comments: lives alone Information entered by :: B.Shasta Chinn,LPN   Activities of Daily Living     12/19/2023   11:15 AM  In your present state of health, do you have any difficulty performing the following activities:  Hearing? 0  Vision? 0  Difficulty concentrating or making decisions? 0  Walking or climbing stairs? 0  Dressing or bathing? 0  Doing errands, shopping? 0  Preparing Food and eating ? N  Using the Toilet? N  In the past six months, have you accidently leaked urine? Y  Do you have problems with loss of bowel control? N  Managing your Medications? N  Managing your Finances? N  Housekeeping or managing your Housekeeping? N    Patient Care Team: Cleatus Arlyss RAMAN, MD as PCP - General (Family Medicine) Glean Stephane BROCKS, RN (Inactive) as Oncology Nurse Navigator Tyree Nanetta SAILOR, RN as Oncology Nurse Navigator Vernetta Berg, MD as Consulting Physician (General Surgery) Lanny Callander,  MD as Consulting Physician (Hematology) Dewey Rush, MD as Consulting Physician (Radiation Oncology)  I have updated your Care Teams any recent Medical Services you may have received from other providers in the past year.     Assessment:   This is a routine wellness examination for Sheila Nguyen.  Hearing/Vision screen Hearing Screening - Comments:: Pt says her hearing is very good Vision Screening - Comments:: Pt says her vision is good;glasses for driving and distance Jan having cataract surgery From Dr Camillo to [place]   Goals Addressed             This Visit's Progress    Patient Stated   On track    12/19/23-Work on weight loss        Depression Screen     12/19/2023   11:12 AM 01/03/2023    2:08 PM 12/13/2022   11:35 AM 12/07/2021    2:34 PM 01/13/2020    3:08 PM 10/06/2019    2:36 PM 12/15/2018   12:36 PM  PHQ 2/9 Scores  PHQ - 2 Score 1 0 0 0 2 0 2  PHQ- 9 Score  0     2    Fall Risk     12/19/2023   11:08 AM 01/03/2023    2:08 PM 12/12/2022   10:31 PM 12/07/2021    2:34 PM 10/06/2019    2:35 PM  Fall Risk   Falls in the past year? 0 0 0 0 0  Number falls in past yr: 0 0 0 0   Injury with Fall? 0 0 0 0   Risk for fall due to : No Fall Risks No Fall Risks Impaired vision No Fall Risks   Follow up Education provided;Falls prevention discussed Falls evaluation completed Falls prevention discussed Falls evaluation completed       Data saved with a previous flowsheet row definition    MEDICARE RISK AT HOME:  Medicare Risk at Home Any stairs in or around the home?: Yes If so, are there any without handrails?: Yes Home free of loose throw rugs in walkways, pet beds, electrical cords, etc?: Yes Adequate lighting in your home to reduce risk of falls?: Yes Life alert?: No Use of a cane, walker or w/c?: Yes Grab bars  in the bathroom?: Yes Shower chair or bench in shower?: No Elevated toilet seat or a handicapped toilet?: No  TIMED UP AND GO:  Was the test  performed?  No  Cognitive Function: 6CIT completed        12/19/2023   11:15 AM 12/13/2022   11:37 AM  6CIT Screen  What Year? 0 points 0 points  What month? 0 points 0 points  What time? 0 points 0 points  Count back from 20 0 points 0 points  Months in reverse 0 points 0 points  Repeat phrase 0 points 0 points  Total Score 0 points 0 points    Immunizations Immunization History  Administered Date(s) Administered   Hepatitis A 11/09/2011, 10/14/2012   Influenza Split 03/01/2011   Influenza, High Dose Seasonal PF 02/20/2016, 01/30/2019   Influenza-Unspecified 02/04/2013, 02/04/2018, 02/05/2020   Moderna Sars-Covid-2 Vaccination 05/19/2019, 06/16/2019, 03/07/2020   Pneumococcal Conjugate-13 10/22/2013   Pneumococcal Polysaccharide-23 07/20/2008   Td 05/07/2008   Tdap 05/20/2013   Zoster Recombinant(Shingrix) 10/18/2019, 06/05/2020   Zoster, Live 11/09/2011    Screening Tests Health Maintenance  Topic Date Due   COVID-19 Vaccine (4 - 2024-25 season) 01/06/2023   DTaP/Tdap/Td (3 - Td or Tdap) 05/21/2023   INFLUENZA VACCINE  12/06/2023   Medicare Annual Wellness (AWV)  12/18/2024   Pneumococcal Vaccine: 50+ Years  Completed   DEXA SCAN  Completed   Zoster Vaccines- Shingrix  Completed   HPV VACCINES  Aged Out   Meningococcal B Vaccine  Aged Out   Hepatitis C Screening  Discontinued    Health Maintenance  Health Maintenance Due  Topic Date Due   COVID-19 Vaccine (4 - 2024-25 season) 01/06/2023   DTaP/Tdap/Td (3 - Td or Tdap) 05/21/2023   INFLUENZA VACCINE  12/06/2023   Health Maintenance Items Addressed: None due at this time  Additional Screening:  Vision Screening: Recommended annual ophthalmology exams for early detection of glaucoma and other disorders of the eye. Would you like a referral to an eye doctor? No    Dental Screening: Recommended annual dental exams for proper oral hygiene  Community Resource Referral / Chronic Care Management: CRR  required this visit?  No   CCM required this visit?  Appt scheduled with PCP   Plan:    I have personally reviewed and noted the following in the patient's chart:   Medical and social history Use of alcohol, tobacco or illicit drugs  Current medications and supplements including opioid prescriptions. Patient is not currently taking opioid prescriptions. Functional ability and status Nutritional status Physical activity Advanced directives List of other physicians Hospitalizations, surgeries, and ER visits in previous 12 months Vitals Screenings to include cognitive, depression, and falls Referrals and appointments  In addition, I have reviewed and discussed with patient certain preventive protocols, quality metrics, and best practice recommendations. A written personalized care plan for preventive services as well as general preventive health recommendations were provided to patient.   Erminio LITTIE Saris, LPN   1/85/7974   After Visit Summary: (MyChart) Due to this being a telephonic visit, the after visit summary with patients personalized plan was offered to patient via MyChart   Notes: Nothing significant to report at this time.

## 2023-12-19 NOTE — Telephone Encounter (Signed)
 Please place future lab orders so that patient can have labs done at [facility]. Thank you

## 2023-12-19 NOTE — Patient Instructions (Addendum)
 Sheila Nguyen , Thank you for taking time out of your busy schedule to complete your Annual Wellness Visit with me. I enjoyed our conversation and look forward to speaking with you again next year. I, as well as your care team,  appreciate your ongoing commitment to your health goals. Please review the following plan we discussed and let me know if I can assist you in the future. Your Game plan/ To Do List    Referrals: If you haven't heard from the office you've been referred to, please reach out to them at the phone provided.   Follow up Visits: We will see or speak with you next year for your Next Medicare AWV with our clinical staff-12/22/24 @ 10:50am televisit Have you seen your provider in the last 6 months (3 months if uncontrolled diabetes)? No  Clinician Recommendations:  Aim for 30 minutes of exercise or brisk walking, 6-8 glasses of water, and 5 servings of fruits and vegetables each day.       This is a list of the screenings recommended for you:  Health Maintenance  Topic Date Due   COVID-19 Vaccine (4 - 2024-25 season) 01/06/2023   DTaP/Tdap/Td vaccine (3 - Td or Tdap) 05/21/2023   Flu Shot  12/06/2023   Medicare Annual Wellness Visit  12/18/2024   Pneumococcal Vaccine for age over 34  Completed   DEXA scan (bone density measurement)  Completed   Zoster (Shingles) Vaccine  Completed   HPV Vaccine  Aged Out   Meningitis B Vaccine  Aged Out   Hepatitis C Screening  Discontinued    Advanced directives: (Copy Requested) Please bring a copy of your health care power of attorney and living will to the office to be added to your chart at your convenience. You can mail to [facility] [address] [place], [place] [address] or email to [email or web address] Advance Care Planning is important because it:  [x]  Makes sure you receive the medical care that is consistent with your values, goals, and preferences  [x]  It provides guidance to your family and loved  ones and reduces their decisional burden about whether or not they are making the right decisions based on your wishes.  Follow the link provided in your after visit summary or read over the paperwork we have mailed to you to help you started getting your Advance Directives in place. If you need assistance in completing these, please reach out to us  so that we can help you!

## 2023-12-23 NOTE — Telephone Encounter (Signed)
 Patient notified that order has been placed for labs.

## 2023-12-23 NOTE — Telephone Encounter (Signed)
 Orders placed. Thanks.

## 2024-01-07 ENCOUNTER — Other Ambulatory Visit: Payer: Self-pay | Admitting: Family Medicine

## 2024-01-07 MED ORDER — CITALOPRAM HYDROBROMIDE 10 MG PO TABS: 10.0000 mg | ORAL_TABLET | Freq: Every day | ORAL | 3 refills | Status: AC

## 2024-01-07 NOTE — Telephone Encounter (Signed)
 Copied from CRM #[id number]. Topic: Clinical - Medication Refill >> Jan 07, 2024  9:45 AM Precious C wrote: Medication: citalopram  (CELEXA ) 10 MG tablet  Has the patient contacted their pharmacy? Yes (Agent: If no, request that the patient contact the pharmacy for the refill. If patient does not wish to contact the pharmacy document the reason why and proceed with request.) (Agent: If yes, when and what did the pharmacy advise?)  This is the patient's preferred pharmacy:  [id number] - [place], [place] - [address] [place] [address] Phone: [phone] Fax: [phone]  Is this the correct pharmacy for this prescription? Yes If no, delete pharmacy and type the correct one.   Has the prescription been filled recently? No  Is the patient out of the medication? Yes  Has the patient been seen for an appointment in the last year OR does the patient have an upcoming appointment? Yes  Can we respond through MyChart? Yes  Agent: Please be advised that Rx refills may take up to 3 business days. We ask that you follow-up with your pharmacy.

## 2024-02-04 ENCOUNTER — Other Ambulatory Visit (INDEPENDENT_AMBULATORY_CARE_PROVIDER_SITE_OTHER)

## 2024-02-04 DIAGNOSIS — I10 Essential (primary) hypertension: Secondary | ICD-10-CM | POA: Diagnosis not present

## 2024-02-04 DIAGNOSIS — M8580 Other specified disorders of bone density and structure, unspecified site: Secondary | ICD-10-CM | POA: Diagnosis not present

## 2024-02-04 LAB — CBC WITH DIFFERENTIAL/PLATELET
Basophils Absolute: 0 K/uL (ref 0.0–0.1)
Basophils Relative: 0.3 % (ref 0.0–3.0)
Eosinophils Absolute: 0.1 K/uL (ref 0.0–0.7)
Eosinophils Relative: 1.5 % (ref 0.0–5.0)
HCT: 43.1 % (ref 36.0–46.0)
Hemoglobin: 14.2 g/dL (ref 12.0–15.0)
Lymphocytes Relative: 34.9 % (ref 12.0–46.0)
Lymphs Abs: 2 K/uL (ref 0.7–4.0)
MCHC: 32.9 g/dL (ref 30.0–36.0)
MCV: 89 fl (ref 78.0–100.0)
Monocytes Absolute: 0.7 K/uL (ref 0.1–1.0)
Monocytes Relative: 12 % (ref 3.0–12.0)
Neutro Abs: 2.9 K/uL (ref 1.4–7.7)
Neutrophils Relative %: 51.3 % (ref 43.0–77.0)
Platelets: 265 K/uL (ref 150.0–400.0)
RBC: 4.84 Mil/uL (ref 3.87–5.11)
RDW: 14.1 % (ref 11.5–15.5)
WBC: 5.7 K/uL (ref 4.0–10.5)

## 2024-02-04 LAB — LIPID PANEL
Cholesterol: 136 mg/dL (ref 0–200)
HDL: 40.4 mg/dL (ref >39.00)
LDL Cholesterol: 70 mg/dL (ref 0–99)
NonHDL: 95.12
Total CHOL/HDL Ratio: 3
Triglycerides: 125 mg/dL (ref 0.0–149.0)
VLDL: 25 mg/dL (ref 0.0–40.0)

## 2024-02-04 LAB — COMPREHENSIVE METABOLIC PANEL WITH GFR
ALT: 22 U/L (ref 0–35)
AST: 18 U/L (ref 0–37)
Albumin: 4.3 g/dL (ref 3.5–5.2)
Alkaline Phosphatase: 150 U/L — ABNORMAL HIGH (ref 39–117)
BUN: 13 mg/dL (ref 6–23)
CO2: 26 meq/L (ref 19–32)
Calcium: 9.7 mg/dL (ref 8.4–10.5)
Chloride: 103 meq/L (ref 96–112)
Creatinine, Ser: 0.73 mg/dL (ref 0.40–1.20)
GFR: 77.6 mL/min (ref >60.00)
Glucose, Bld: 114 mg/dL — ABNORMAL HIGH (ref 70–99)
Potassium: 4.2 meq/L (ref 3.5–5.1)
Sodium: 138 meq/L (ref 135–145)
Total Bilirubin: 0.7 mg/dL (ref 0.2–1.2)
Total Protein: 7.5 g/dL (ref 6.0–8.3)

## 2024-02-04 LAB — VITAMIN D 25 HYDROXY (VIT D DEFICIENCY, FRACTURES): VITD: 31.87 ng/mL (ref 30.00–100.00)

## 2024-02-06 ENCOUNTER — Ambulatory Visit: Payer: Self-pay | Admitting: Family Medicine

## 2024-02-07 ENCOUNTER — Ambulatory Visit (INDEPENDENT_AMBULATORY_CARE_PROVIDER_SITE_OTHER): Admitting: Family Medicine

## 2024-02-07 ENCOUNTER — Other Ambulatory Visit: Payer: Self-pay | Admitting: Hematology and Oncology

## 2024-02-07 ENCOUNTER — Encounter: Payer: Self-pay | Admitting: Family Medicine

## 2024-02-07 VITALS — BP 142/82 | HR 82 | Temp 97.7°F | Ht 61.61 in | Wt 164.4 lb

## 2024-02-07 DIAGNOSIS — Z7189 Other specified counseling: Secondary | ICD-10-CM

## 2024-02-07 DIAGNOSIS — R748 Abnormal levels of other serum enzymes: Secondary | ICD-10-CM | POA: Diagnosis not present

## 2024-02-07 DIAGNOSIS — E7800 Pure hypercholesterolemia, unspecified: Secondary | ICD-10-CM | POA: Diagnosis not present

## 2024-02-07 DIAGNOSIS — I10 Essential (primary) hypertension: Secondary | ICD-10-CM

## 2024-02-07 DIAGNOSIS — F419 Anxiety disorder, unspecified: Secondary | ICD-10-CM | POA: Diagnosis not present

## 2024-02-07 DIAGNOSIS — C50412 Malignant neoplasm of upper-outer quadrant of left female breast: Secondary | ICD-10-CM

## 2024-02-07 DIAGNOSIS — Z0000 Encounter for general adult medical examination without abnormal findings: Secondary | ICD-10-CM

## 2024-02-07 MED ORDER — OLMESARTAN MEDOXOMIL 20 MG PO TABS: 20.0000 mg | ORAL_TABLET | Freq: Every day | ORAL | 3 refills | Status: AC

## 2024-02-07 MED ORDER — OLMESARTAN MEDOXOMIL 5 MG PO TABS: 20.0000 mg | ORAL_TABLET | Freq: Every day | ORAL | Status: DC

## 2024-02-07 MED ORDER — BUPROPION HCL ER (SR) 100 MG PO TB12: ORAL_TABLET | ORAL | 3 refills | Status: AC

## 2024-02-07 MED ORDER — ALPRAZOLAM 0.25 MG PO TABS: ORAL_TABLET | ORAL | 1 refills | Status: AC

## 2024-02-07 MED ORDER — PRAVASTATIN SODIUM 40 MG PO TABS: ORAL_TABLET | ORAL | 3 refills | Status: AC

## 2024-02-07 NOTE — Patient Instructions (Addendum)
 Tetanus shot when possible- it may be cheaper at the pharmacy.  I would increase vitamin D  to 4000 units per day.  Take care.  Glad to see you.

## 2024-02-07 NOTE — Progress Notes (Signed)
 Hypertension:    Using medication without problems or lightheadedness: yes Chest pain with exertion:no Edema:no Short of breath: no Average home BPs: usually 130s/70s.  Med list updated re: benicar .    Elevated Cholesterol: Using medications without problems:yes Muscle aches: no Diet compliance: d/w pt.  Exercise: d/w pt.   Mood is good with citalopram  and wellbutrin .  She has used xanax  with flying.  She went to [place] again.  She is travelling to [place]  soon.     Flu to be done fall 2025.   Shingles 2021 PNA previously done Tetanus 2015 COVID-vaccine to be done fall 2025.  RSV vaccine d/w pt.  She was concerned about polysorbate component.  Colon cancer screening declined by patient. Breast cancer screening 2025 Bone density test 2024 Advance directive-son designated if patient were incapacitated.  Alk phos elevation d/w pt. Prev bone scan with IMPRESSION: No evidence of osseous metastatic disease. D/w pt about restart vit D 4000 units per day, given the dec in vit D level.    Meds, vitals, and allergies reviewed.   PMH and SH reviewed  ROS: Per HPI unless specifically indicated in ROS section   GEN: nad, alert and oriented HEENT: mucous membranes moist NECK: supple w/o LA CV: rrr. PULM: ctab, no inc wob ABD: soft, +bs EXT: no edema SKIN: no acute rash

## 2024-02-09 NOTE — Assessment & Plan Note (Signed)
 Flu to be done fall 2025.   Shingles 2021 PNA previously done Tetanus 2015 COVID-vaccine to be done fall 2025.  RSV vaccine d/w pt.  She was concerned about polysorbate component.  Colon cancer screening declined by patient. Breast cancer screening 2025 Bone density test 2024 Advance directive-son designated if patient were incapacitated.

## 2024-02-09 NOTE — Assessment & Plan Note (Signed)
 Mood is good with citalopram  and wellbutrin .  She has used xanax  with flying.  She went to [place] again.  She is travelling to [place]  soon.  Continue as is.

## 2024-02-09 NOTE — Assessment & Plan Note (Signed)
 Continue work on diet and exercise.  Continue pravastatin .  Update me as needed.

## 2024-02-09 NOTE — Assessment & Plan Note (Signed)
 Continue 20 mg of Benicar .  If she gets lightheaded she can cut back and update me.  Discussed diet and exercise.  Labs discussed.

## 2024-02-09 NOTE — Assessment & Plan Note (Signed)
History of, per oncology.  I will defer.

## 2024-02-09 NOTE — Assessment & Plan Note (Signed)
 Alk phos elevation d/w pt. Prev bone scan with IMPRESSION: No evidence of osseous metastatic disease. D/w pt about restart vit D 4000 units per day, given the dec in vit D level.

## 2024-12-09 ENCOUNTER — Ambulatory Visit: Admitting: Hematology and Oncology

## 2024-12-22 ENCOUNTER — Ambulatory Visit
